# Patient Record
Sex: Female | Born: 1965 | Race: White | Hispanic: No | Smoking: Never smoker
Health system: Southern US, Community
[De-identification: ages and names within clinical notes are randomized; demographics above are authoritative.]

## PROBLEM LIST (undated history)

## (undated) DIAGNOSIS — G43909 Migraine, unspecified, not intractable, without status migrainosus: Secondary | ICD-10-CM

## (undated) DIAGNOSIS — F32A Depression, unspecified: Secondary | ICD-10-CM

## (undated) DIAGNOSIS — E039 Hypothyroidism, unspecified: Secondary | ICD-10-CM

## (undated) DIAGNOSIS — K219 Gastro-esophageal reflux disease without esophagitis: Secondary | ICD-10-CM

## (undated) DIAGNOSIS — F329 Major depressive disorder, single episode, unspecified: Secondary | ICD-10-CM

## (undated) DIAGNOSIS — Z8632 Personal history of gestational diabetes: Secondary | ICD-10-CM

## (undated) DIAGNOSIS — K589 Irritable bowel syndrome without diarrhea: Secondary | ICD-10-CM

## (undated) DIAGNOSIS — F419 Anxiety disorder, unspecified: Secondary | ICD-10-CM

## (undated) DIAGNOSIS — G5603 Carpal tunnel syndrome, bilateral upper limbs: Secondary | ICD-10-CM

## (undated) DIAGNOSIS — I10 Essential (primary) hypertension: Secondary | ICD-10-CM

## (undated) DIAGNOSIS — T7840XA Allergy, unspecified, initial encounter: Secondary | ICD-10-CM

## (undated) DIAGNOSIS — N8110 Cystocele, unspecified: Secondary | ICD-10-CM

## (undated) DIAGNOSIS — C801 Malignant (primary) neoplasm, unspecified: Secondary | ICD-10-CM

## (undated) DIAGNOSIS — Z8619 Personal history of other infectious and parasitic diseases: Secondary | ICD-10-CM

## (undated) HISTORY — DX: Cystocele, unspecified: N81.10

## (undated) HISTORY — DX: Personal history of other infectious and parasitic diseases: Z86.19

## (undated) HISTORY — DX: Major depressive disorder, single episode, unspecified: F32.9

## (undated) HISTORY — DX: Hypothyroidism, unspecified: E03.9

## (undated) HISTORY — DX: Allergy, unspecified, initial encounter: T78.40XA

## (undated) HISTORY — DX: Irritable bowel syndrome, unspecified: K58.9

## (undated) HISTORY — DX: Anxiety disorder, unspecified: F41.9

## (undated) HISTORY — DX: Migraine, unspecified, not intractable, without status migrainosus: G43.909

## (undated) HISTORY — PX: WISDOM TOOTH EXTRACTION: SHX21

## (undated) HISTORY — PX: COLONOSCOPY: SHX174

## (undated) HISTORY — DX: Depression, unspecified: F32.A

---

## 1999-05-24 ENCOUNTER — Encounter: Admission: RE | Admit: 1999-05-24 | Discharge: 1999-05-24 | Payer: Self-pay | Admitting: *Deleted

## 1999-07-07 ENCOUNTER — Other Ambulatory Visit: Admission: RE | Admit: 1999-07-07 | Discharge: 1999-07-07 | Payer: Self-pay | Admitting: Gynecology

## 2000-06-05 ENCOUNTER — Other Ambulatory Visit: Admission: RE | Admit: 2000-06-05 | Discharge: 2000-06-05 | Payer: Self-pay | Admitting: Obstetrics and Gynecology

## 2000-08-14 ENCOUNTER — Encounter: Payer: Self-pay | Admitting: Obstetrics and Gynecology

## 2000-08-14 ENCOUNTER — Ambulatory Visit (HOSPITAL_COMMUNITY): Admission: RE | Admit: 2000-08-14 | Discharge: 2000-08-14 | Payer: Self-pay | Admitting: Obstetrics and Gynecology

## 2001-07-03 ENCOUNTER — Other Ambulatory Visit: Admission: RE | Admit: 2001-07-03 | Discharge: 2001-07-03 | Payer: Self-pay | Admitting: Obstetrics and Gynecology

## 2002-08-06 ENCOUNTER — Other Ambulatory Visit: Admission: RE | Admit: 2002-08-06 | Discharge: 2002-08-06 | Payer: Self-pay | Admitting: Obstetrics and Gynecology

## 2003-12-30 ENCOUNTER — Other Ambulatory Visit: Admission: RE | Admit: 2003-12-30 | Discharge: 2003-12-30 | Payer: Self-pay | Admitting: Obstetrics and Gynecology

## 2005-02-09 ENCOUNTER — Other Ambulatory Visit: Admission: RE | Admit: 2005-02-09 | Discharge: 2005-02-09 | Payer: Self-pay | Admitting: Obstetrics and Gynecology

## 2005-07-04 ENCOUNTER — Ambulatory Visit (HOSPITAL_COMMUNITY): Admission: RE | Admit: 2005-07-04 | Discharge: 2005-07-04 | Payer: Self-pay | Admitting: Obstetrics and Gynecology

## 2005-12-15 ENCOUNTER — Ambulatory Visit: Payer: Self-pay | Admitting: Internal Medicine

## 2006-02-15 ENCOUNTER — Other Ambulatory Visit: Admission: RE | Admit: 2006-02-15 | Discharge: 2006-02-15 | Payer: Self-pay | Admitting: Obstetrics and Gynecology

## 2006-07-25 ENCOUNTER — Ambulatory Visit (HOSPITAL_COMMUNITY): Admission: RE | Admit: 2006-07-25 | Discharge: 2006-07-25 | Payer: Self-pay | Admitting: Obstetrics and Gynecology

## 2007-08-14 ENCOUNTER — Ambulatory Visit (HOSPITAL_COMMUNITY): Admission: RE | Admit: 2007-08-14 | Discharge: 2007-08-14 | Payer: Self-pay | Admitting: Obstetrics and Gynecology

## 2007-08-28 ENCOUNTER — Ambulatory Visit: Payer: Self-pay | Admitting: Internal Medicine

## 2007-08-28 DIAGNOSIS — K5909 Other constipation: Secondary | ICD-10-CM | POA: Insufficient documentation

## 2007-08-28 DIAGNOSIS — O99810 Abnormal glucose complicating pregnancy: Secondary | ICD-10-CM | POA: Insufficient documentation

## 2007-09-10 ENCOUNTER — Ambulatory Visit: Payer: Self-pay | Admitting: Cardiology

## 2007-09-25 ENCOUNTER — Ambulatory Visit (HOSPITAL_COMMUNITY): Admission: RE | Admit: 2007-09-25 | Discharge: 2007-09-25 | Payer: Self-pay | Admitting: Internal Medicine

## 2007-09-25 ENCOUNTER — Encounter: Payer: Self-pay | Admitting: Internal Medicine

## 2007-09-25 HISTORY — PX: COLONOSCOPY: SHX174

## 2008-09-02 ENCOUNTER — Ambulatory Visit (HOSPITAL_COMMUNITY): Admission: RE | Admit: 2008-09-02 | Discharge: 2008-09-02 | Payer: Self-pay | Admitting: Obstetrics and Gynecology

## 2008-09-08 ENCOUNTER — Encounter: Admission: RE | Admit: 2008-09-08 | Discharge: 2008-09-08 | Payer: Self-pay | Admitting: Obstetrics and Gynecology

## 2009-02-08 IMAGING — CT CT ABDOMEN W/ CM
2 of 5 series · 17 of 46 positions shown, 19 images · IV contrast (Omnipaque 300)
Comparison: None.

CT ABDOMEN

CLINICAL DATA: 41-year-old female with right lower quadrant
abdominal pain.

CT ABDOMEN AND PELVIS WITH CONTRAST
TECHNIQUE: Multidetector CT imaging of the abdomen and pelvis was
performed using the standard protocol following bolus
administration of intravenous contrast.
Contrast: 100 ml Zmnipaque-177

[Series 2: abd_pel 5.0 b30f st · axial · 0.69mm/px · z∈[-402,-27]mm · 14 of 85 slices shown, 16 images]
[im 5/85  soft-tissue]
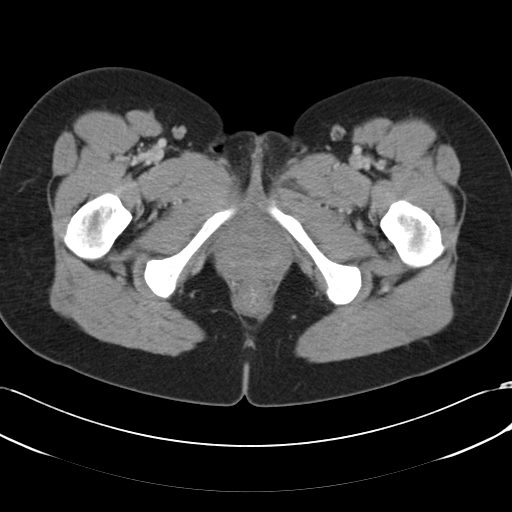
[im 5/85  bone]
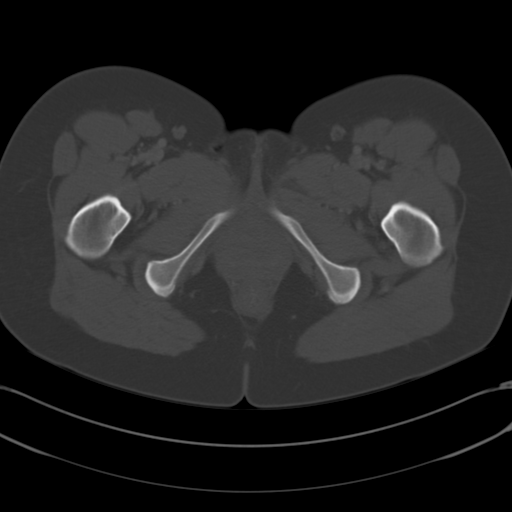
[im 9/85  soft-tissue]
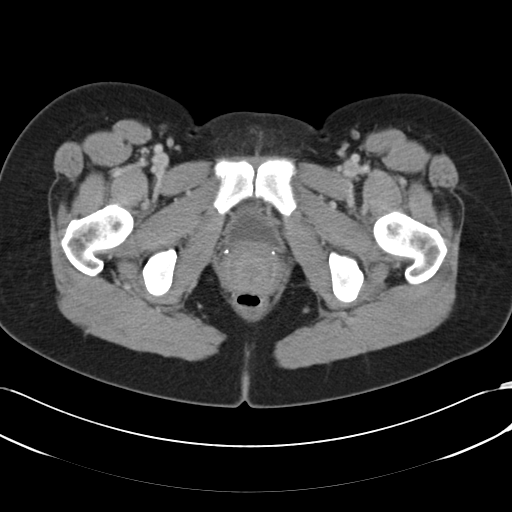
[im 18/85  soft-tissue]
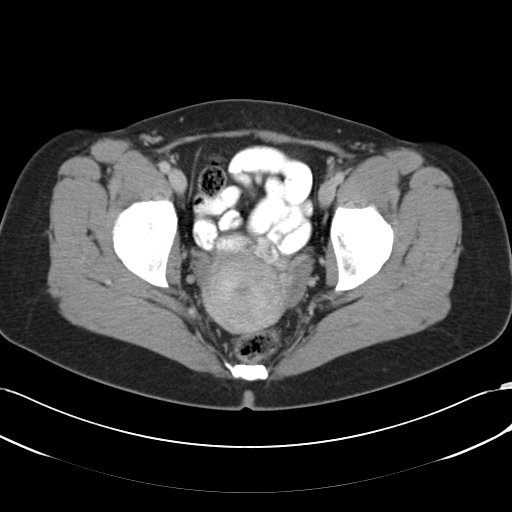
[im 23/85  soft-tissue]
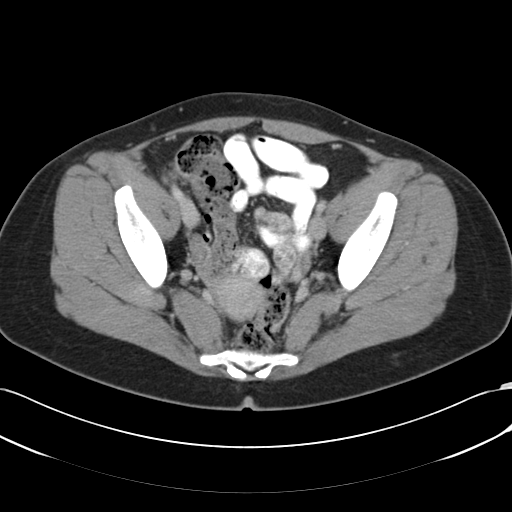
[im 27/85  soft-tissue]
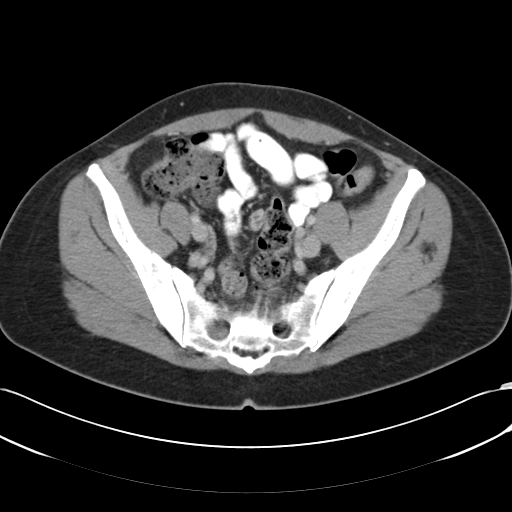
[im 36/85  soft-tissue]
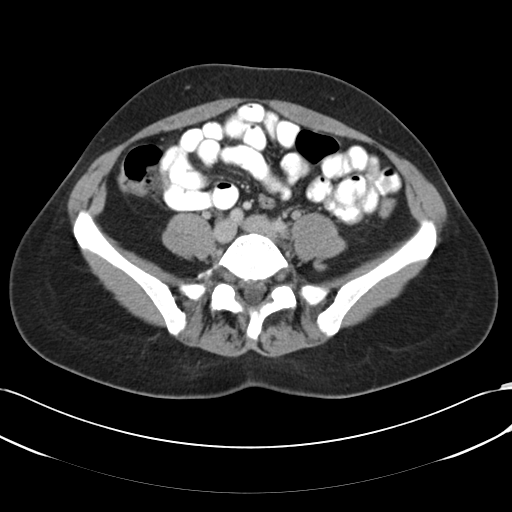
[im 40/85  soft-tissue]
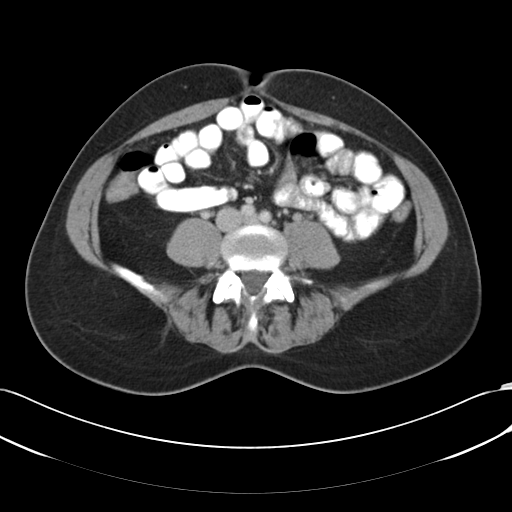
[im 45/85  soft-tissue]
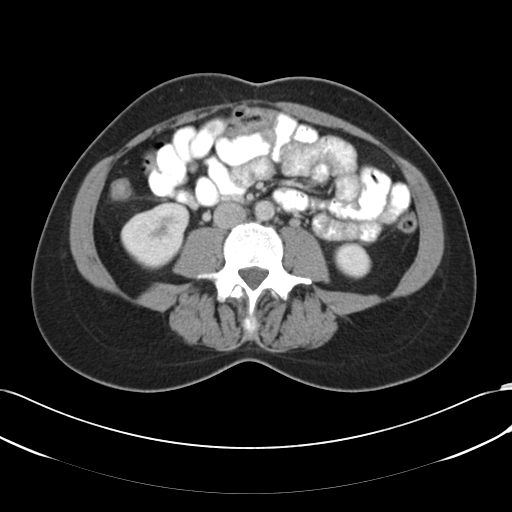
[im 49/85  soft-tissue]
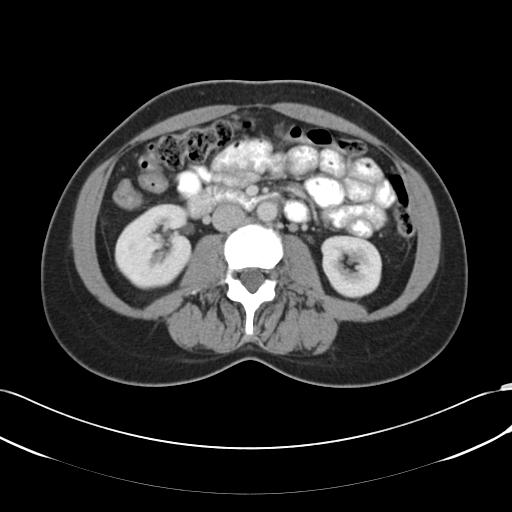
[im 49/85  bone]
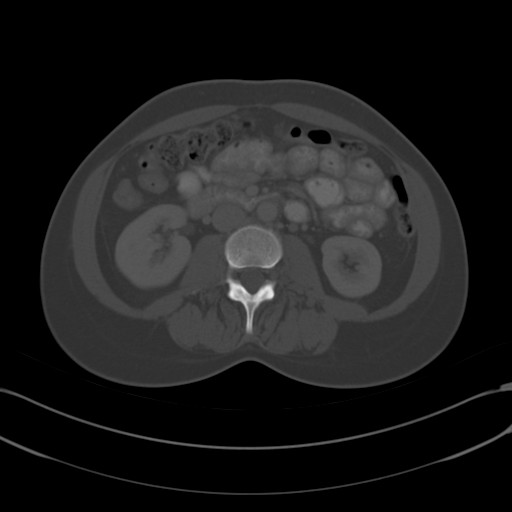
[im 58/85  soft-tissue]
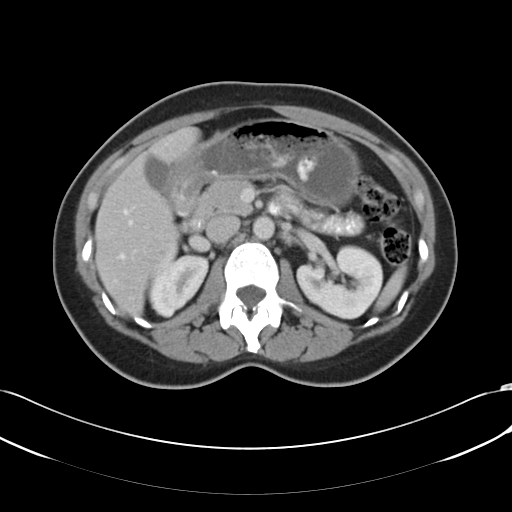
[im 62/85  soft-tissue]
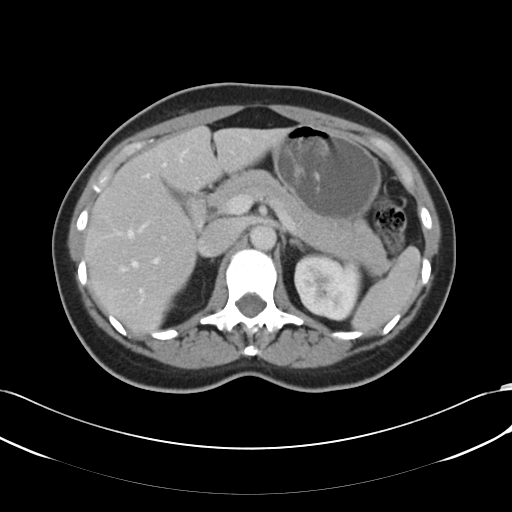
[im 67/85  soft-tissue]
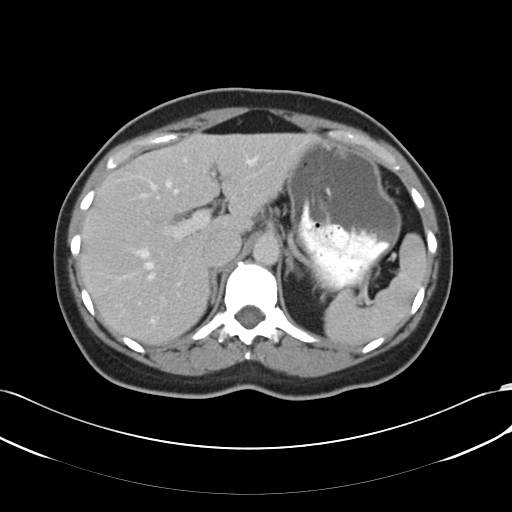
[im 76/85  soft-tissue]
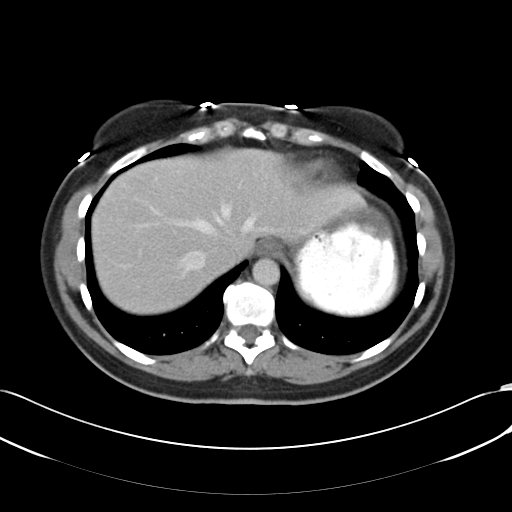
[im 80/85  soft-tissue]
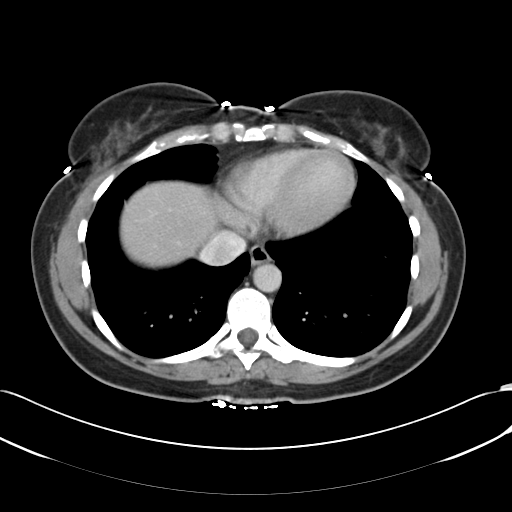

[Series 602: <mpr thick range> · coronal · 0.84mm/px · 3 of 74 slices shown]
[im 25/74  soft-tissue]
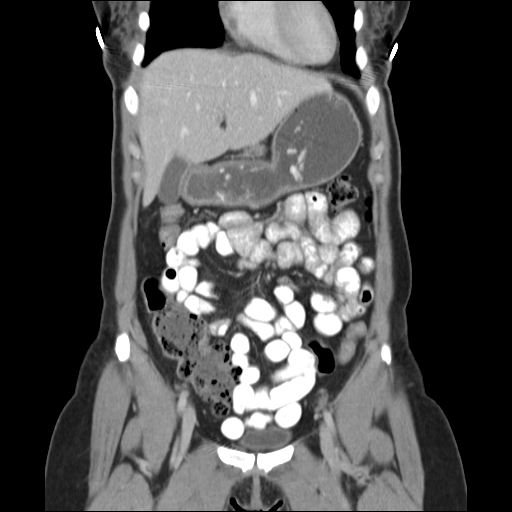
[im 33/74  soft-tissue]
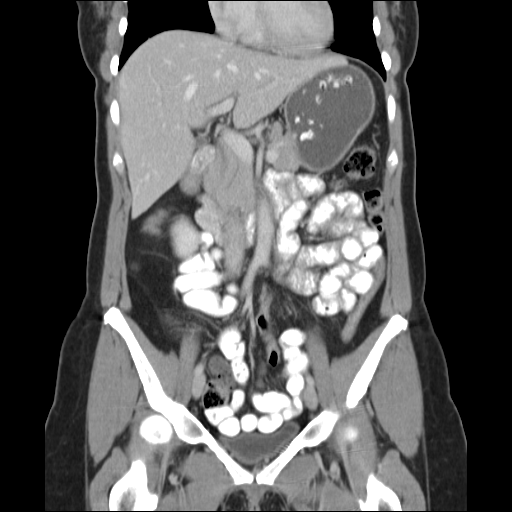
[im 41/74  soft-tissue]
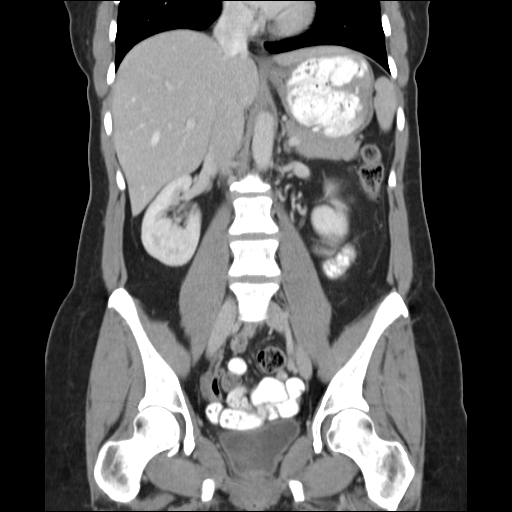

[17 of 46 positions shown; findings below may reference images not displayed]

FINDINGS: The lung bases are clear.  No pericardial or pleural
effusion.  Normal heart size.  Negative for hiatal hernia.

The liver, gallbladder, biliary system, adrenal glands, kidneys,
spleen, and pancreas are within normal limits for age.  No acute
abdominal inflammation, fluid collection, hemorrhage, abscess,
bowel obstruction, dilatation, or free air.  No abdominal wall
hernia.  In the right lower quadrant, the cecum and terminal ileum
are stool-filled.  Appendix is not identified with certainty.  No
acute inflammatory process in this region.
IMPRESSION: No acute intra-abdominal finding.  Negative for hernia.

CT PELVIS
FINDINGS: No pelvic acute inflammation, fluid collection, free
fluid, abscess or hematoma.  No adenopathy.  No acute bowel
finding.  Uterus, ovaries and urinary bladder are unremarkable.
Negative for lower abdominal or inguinal hernia.
IMPRESSION: No acute intrapelvic finding.  Negative for hernia.

## 2009-09-15 ENCOUNTER — Ambulatory Visit (HOSPITAL_COMMUNITY): Admission: RE | Admit: 2009-09-15 | Discharge: 2009-09-15 | Payer: Self-pay | Admitting: Obstetrics and Gynecology

## 2010-07-20 ENCOUNTER — Other Ambulatory Visit (HOSPITAL_COMMUNITY): Payer: Self-pay | Admitting: Family Medicine

## 2010-07-20 DIAGNOSIS — E039 Hypothyroidism, unspecified: Secondary | ICD-10-CM

## 2010-07-20 DIAGNOSIS — M542 Cervicalgia: Secondary | ICD-10-CM

## 2010-07-25 ENCOUNTER — Ambulatory Visit (HOSPITAL_COMMUNITY)
Admission: RE | Admit: 2010-07-25 | Discharge: 2010-07-25 | Disposition: A | Discharge status: Home / Self Care | Payer: Managed Care, Other (non HMO) | Source: Ambulatory Visit | Attending: Family Medicine | Admitting: Family Medicine

## 2010-07-25 DIAGNOSIS — E039 Hypothyroidism, unspecified: Secondary | ICD-10-CM | POA: Insufficient documentation

## 2010-07-25 DIAGNOSIS — M542 Cervicalgia: Secondary | ICD-10-CM | POA: Insufficient documentation

## 2010-07-25 DIAGNOSIS — E049 Nontoxic goiter, unspecified: Secondary | ICD-10-CM | POA: Insufficient documentation

## 2010-08-10 ENCOUNTER — Other Ambulatory Visit (HOSPITAL_COMMUNITY): Payer: Self-pay | Admitting: Obstetrics and Gynecology

## 2010-08-10 DIAGNOSIS — Z1231 Encounter for screening mammogram for malignant neoplasm of breast: Secondary | ICD-10-CM

## 2010-09-21 ENCOUNTER — Ambulatory Visit (HOSPITAL_COMMUNITY)
Admission: RE | Admit: 2010-09-21 | Discharge: 2010-09-21 | Disposition: A | Discharge status: Home / Self Care | Payer: Self-pay | Source: Ambulatory Visit | Attending: Obstetrics and Gynecology | Admitting: Obstetrics and Gynecology

## 2010-09-21 DIAGNOSIS — Z1231 Encounter for screening mammogram for malignant neoplasm of breast: Secondary | ICD-10-CM | POA: Insufficient documentation

## 2010-10-04 NOTE — Assessment & Plan Note (Signed)
[facility]                         GASTROENTEROLOGY OFFICE NOTE   ZANITA, MILLMAN                   MRN:          [id number]  DATE:08/28/2007                            DOB:          Feb 01, 1966    Ms.  Merlin is a 45 year old white female referred by Dr. Andrey Campanile for  evaluation of change in bowel habits and right middle quadrant and right  lower quadrant abdominal pain. We had seen Mercie in 2007 for treatment  of constipation.  She was put on MiraLax and stool softener as well as  glycerin suppositories.  She was evaluated for rectocele by her  gynecologist but has continued to be constipated having difficult  evacuation.  She has recently found probiotics, Align, which seems to  improve her symptoms of constipation.  She is now having regular bowel  movements as long as she takes her Librarian, academic.  She was unable to tolerate  Amitiza and MiraLax.  She denies any rectal bleeding.  There is no  family history of colon cancer.  She is a runner but did not run during  the wintertime.  She is just starting to run again.  While she is  running, she is complaining of right middle quadrant abdominal  discomfort.  The same discomfort also occurs first in the morning before  she gets up when she turns a certain way.  It suggests musculoskeletal  origin.   MEDICATIONS:  1. Align 1 p.o. q d.  2. Birth control pills.   PAST HISTORY:  1. Constipation.  2. Gestational diabetes.   OPERATIONS:  None.   FAMILY HISTORY:  Father has prostate cancer.  No family history of colon  cancer.   SOCIAL HISTORY:  Married with three children.  She has two years of  college.  The patient does not smoke and does not drink alcohol.   REVIEW OF SYSTEMS:  Positive for leakage of urine, constipation,  abdominal pain, otherwise review of systems is negative.   PHYSICAL EXAMINATION:  Blood pressure is 118/72, pulse 80 and weight  133.4 pounds which is a 10-pound weight  gain since we saw her last time  two years ago.  She was alert, oriented, very pleasant in no distress.  Sclera is nonicteric, oral cavity was normal.  Neck was supple with no lymphadenopathy.  Lungs were clear to auscultation.  COR:  Reveals a quiet precordium, normal S1, normal S2.  ABDOMEN:  Soft with good muscle tone.  When sitting up and laying down,  there was a tenderness in the abdominal wall in the right lower  quadrant.  The same could be reproduced by right leg raising or pushing  up or pushing down.  I could not appreciate any hernia.  The groin did  not seem to be tender.  Liver edge was at the costal margin, bowel  sounds were normoactive.  Left lower and upper quadrants were normal.  RECTAL EXAM:  Showed normal rectal tone with soft and hemoccult negative  stool.  There was no evidence of fecal impaction.   IMPRESSION:  1. A 45 year old white female with abdominal pain and constipation.  As far as the abdominal pain is concerned, some of it has elements      of musculoskeletal pain originating in the abdominal wall or      possiblyit may be due to  an internal hernia.  For that reason, I      would proceed with CT scan of the abdomen and pelvis to rule out      structural abnormality within the abdomen  2. Constipation with pelvic floor dysfunction.  Rule out colonic      inertia, rule out redundant colon.  Rule out diverticular disease      of the colon.  Doubt colonic obstruction.   PLAN:  1. Continue Align probiotic  2. Booklet on constipation.  3. Colonoscopy scheduled.  4. CT scan of the abdomen and pelvis with attention to right lower      quadrant to rule out internal hernia.     Hedwig Morton. Juanda Chance, MD  Electronically Signed    DMB/MedQ  DD: 08/28/2007  DT: 08/28/2007  Job #: [id number]   cc:   Gloriajean Dell. Andrey Campanile, M.D.  Evelena Peat, M.D.

## 2010-10-07 NOTE — Assessment & Plan Note (Signed)
[facility]                           GASTROENTEROLOGY OFFICE NOTE   Tamara Mccoy, Tamara Mccoy                   MRN:          [id number]  DATE:12/15/2005                            DOB:          03-Jan-1966    Referring physician - self referred.   PROBLEM:  Constipation and difficulty evacuating bowels.   HISTORY:  Tamara Mccoy is a pleasant 45 year old white female generally in good  health who has no known chronic medical problems and is not on any regular  medications.  She says that she has had her current problem of her eleven  years since the birth of her last child.  She said she had a very difficult  labor and actually developed some diastasis of her lower abdominal muscles  because of that labor.  She says ever since that time she has had trouble  with constipation.  She says at times, she is able to have a normal bowel  movement but often has to push on her abdominal wall to defecate and at  times has to manually extract the stool because despite straining etc., she  is unable to evacuate the stool.  She occasionally sees a small amount of  bright red blood on the tissue.  Has not had any more extensive bleeding.  She says she always has a constipated pellet-like stools.  She apparently  did recent hemoccult through [facility] which were negative and has  tried Citracal but has not tried any other laxatives, fiber supplements etc.  She is unclear whether or not she had been evaluated for rectocele,  cystocele.  She is due for followup with her gynecologist Dr. Huel Cote in September.   CURRENT MEDICATIONS:  None.   ALLERGIES:  Had blurred vision with the Z-PAK.   PAST HISTORY:  Benign.  Status post childbirth x3.  She did have gestational  diabetes.   FAMILY HISTORY:  Negative for colon cancer, inflammatory bowel disease,  polyposis.  Father with prostate cancer.   SOCIAL HISTORY:  Patient is married.  She has 3 children.   She is employed  with the [employer] ([employer]).   REVIEW OF SYSTEMS:  Negative.  She very occasionally has some stress urinary  incontinence.  Last menstrual period November 22, 2005.   PHYSICAL EXAMINATION:  GENERAL:  Well developed white female in no acute  distress.  CARDIOVASCULAR:  Regular rate and rhythm with S1 and S2.  PULMONARY:  Clear to auscultation and percussion.  ABDOMEN:  Soft, bowel sounds are active.  She is non-tender. There is no  palpable mass or hepatosplenomegaly.  RECTAL:  Negative for occult blood.  Sphincter tone is normal.  No mass.  VITAL SIGNS: Blood pressure 118/72.  Weight is 123.6.  Pulse is 80.   IMPRESSION:  45 year old white female with chronic constipation and symptoms  consistent with pelvic floor relaxation/dysfunction.  Rule out rectocele  though she does not really have any urinary symptoms.   PLAN:  1.  Patient was advised to push fluids - 6-8 glasses of water per day. Add      Benefiber 1 packet per  day in water.  2.  Add MiraLax 17 grams in 8 oz. of  water daily.  3.  Daily exercise in the form of walking or biking etc. to strengthen      pelvic muscles.  4.  Glycerin suppositories 1-2 times weekly on an as needed basis.  5.  Would ask Dr. Senaida Ores to evaluate the patient for pelvic floor      dysfunction and rectocele at the time of her next gynecological      appointment.                                   Amy Grainfield, PA-C                                Dora M. Juanda Chance, MD   AE/MedQ  DD:  12/15/2005  DT:  12/15/2005  Job #:  [id number]

## 2010-11-09 ENCOUNTER — Other Ambulatory Visit: Payer: Self-pay | Admitting: Endocrinology

## 2010-11-09 DIAGNOSIS — E049 Nontoxic goiter, unspecified: Secondary | ICD-10-CM

## 2011-01-31 ENCOUNTER — Other Ambulatory Visit: Payer: Managed Care, Other (non HMO)

## 2011-12-24 IMAGING — US US SOFT TISSUE HEAD/NECK
1 series · 14 of 25 positions shown · non-contrast
Comparison: None.

CLINICAL DATA: Neck pain.  Hypothyroidism.

THYROID ULTRASOUND
TECHNIQUE: Ultrasound examination of the thyroid gland and adjacent
soft tissues was performed.

[Series 1: us soft tissue head/neck · 0.08mm/px · 14 of 54 slices shown]
[im 1/54]
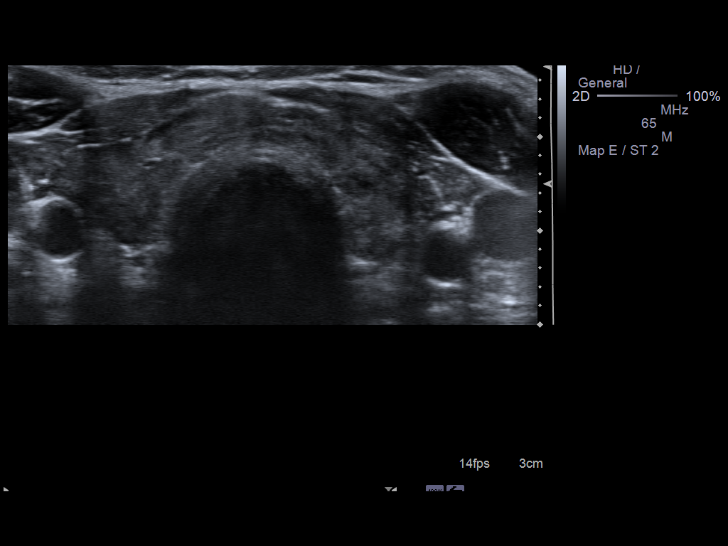
[im 5/54]
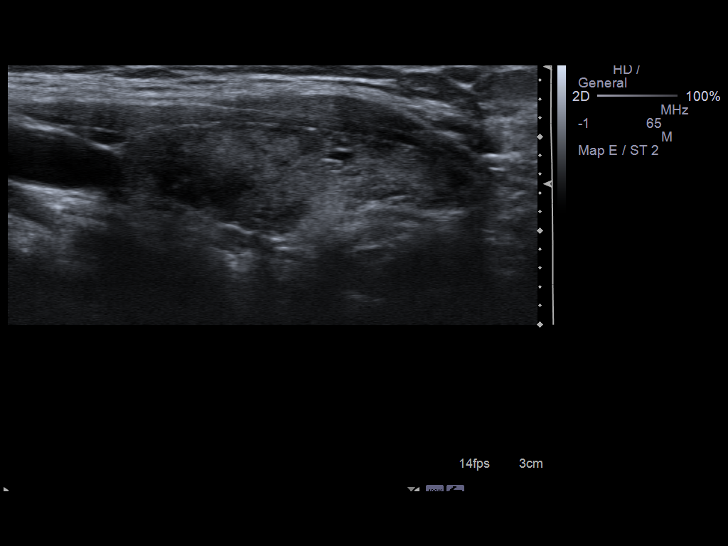
[im 9/54]
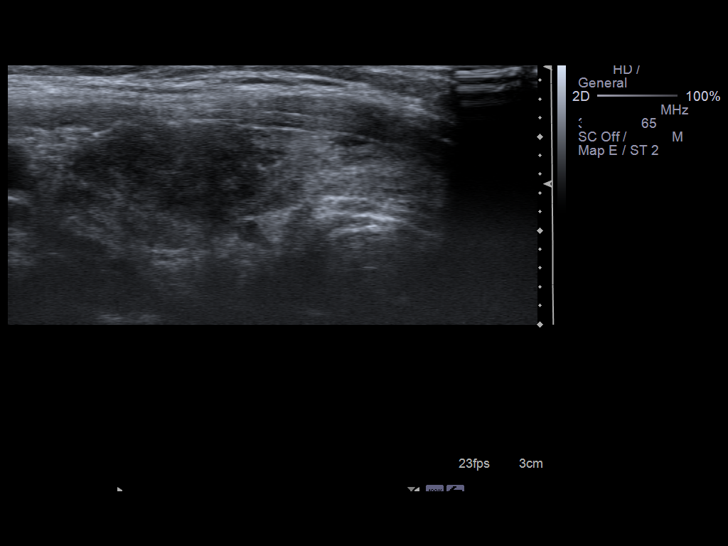
[im 14/54]
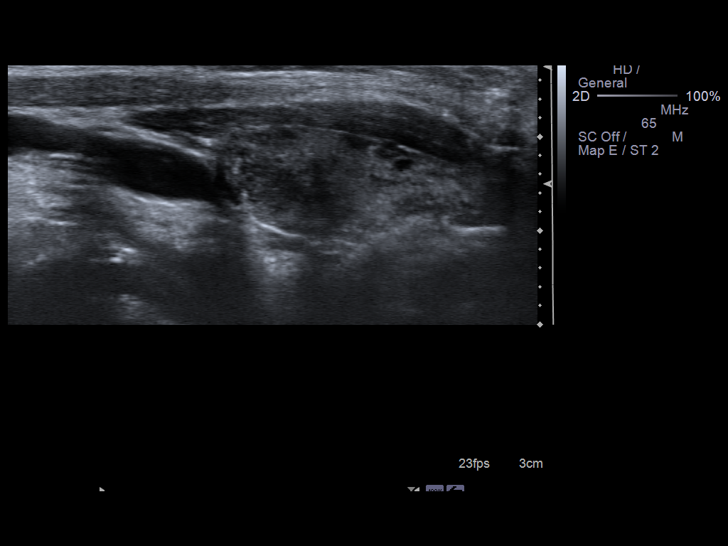
[im 18/54]
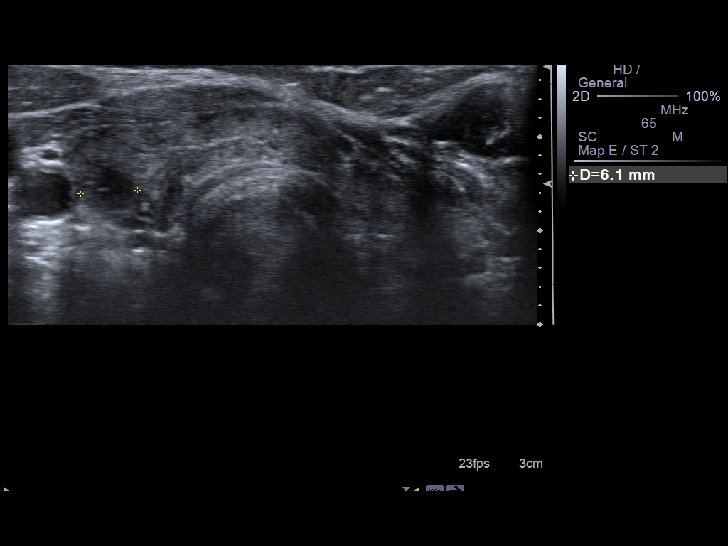
[im 20/54]
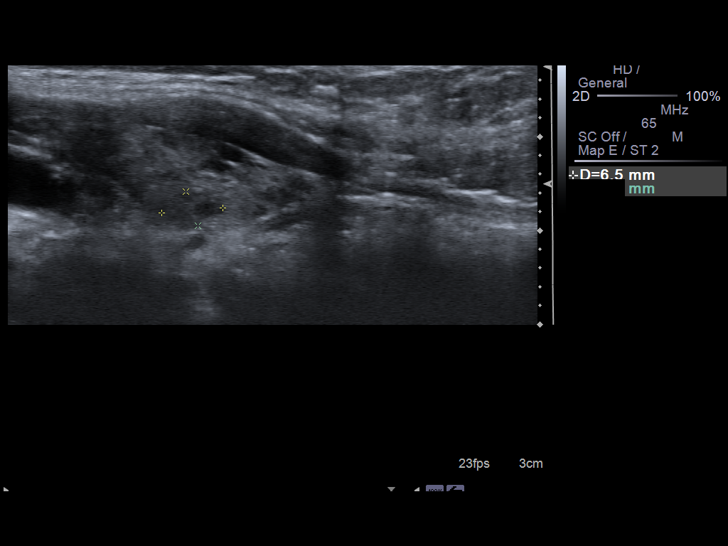
[im 25/54]
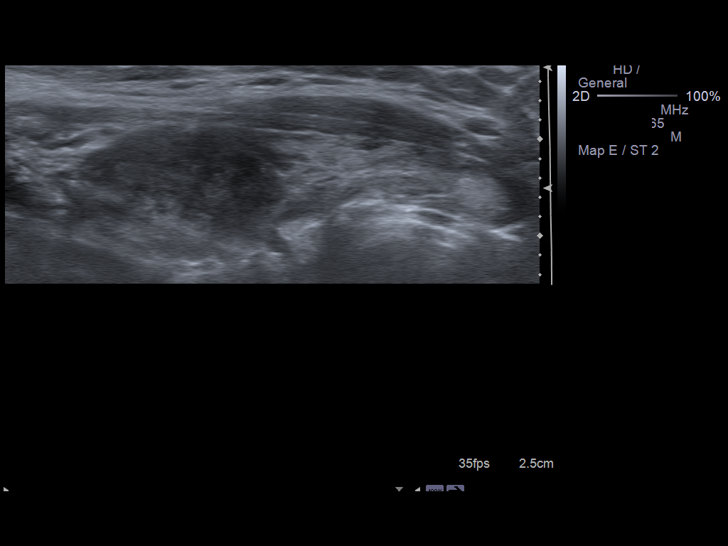
[im 29/54]
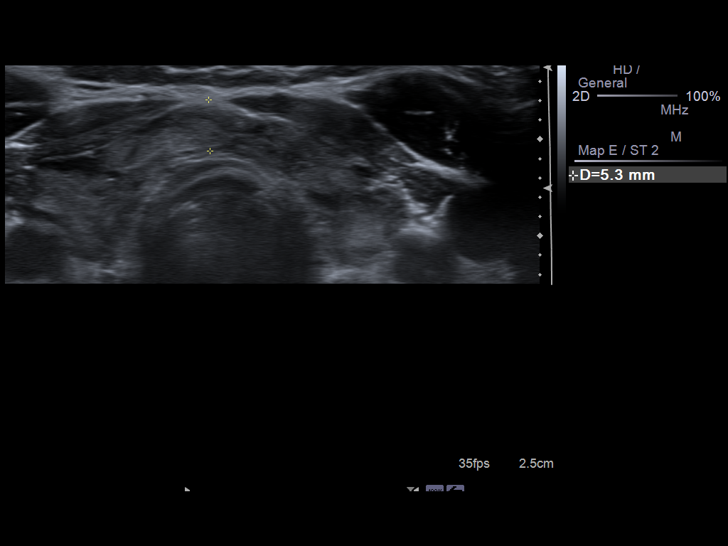
[im 34/54]
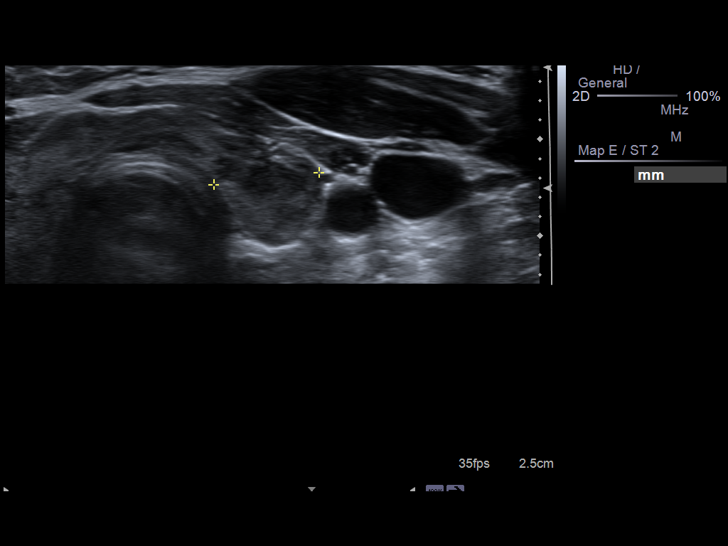
[im 36/54]
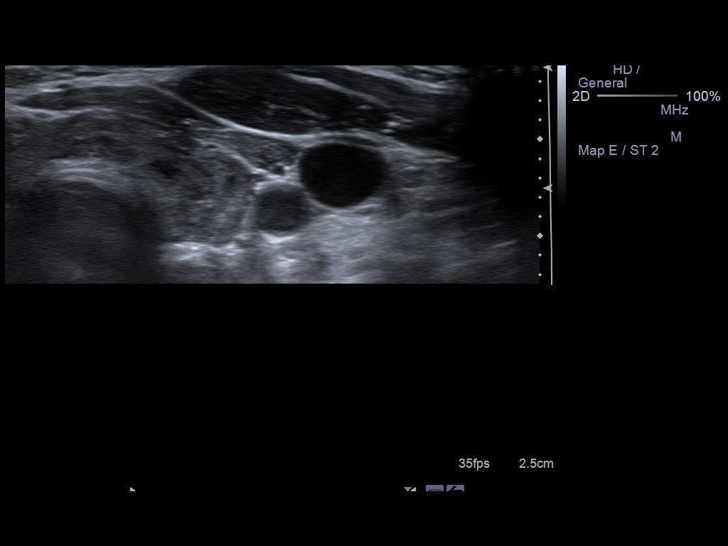
[im 40/54]
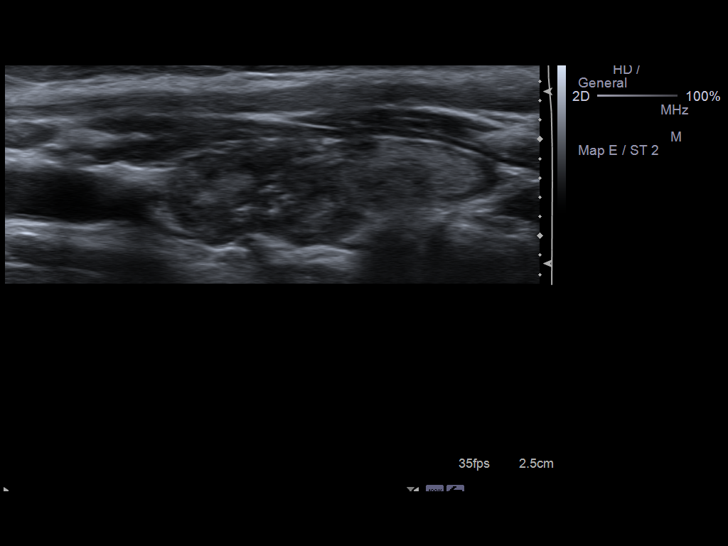
[im 45/54]
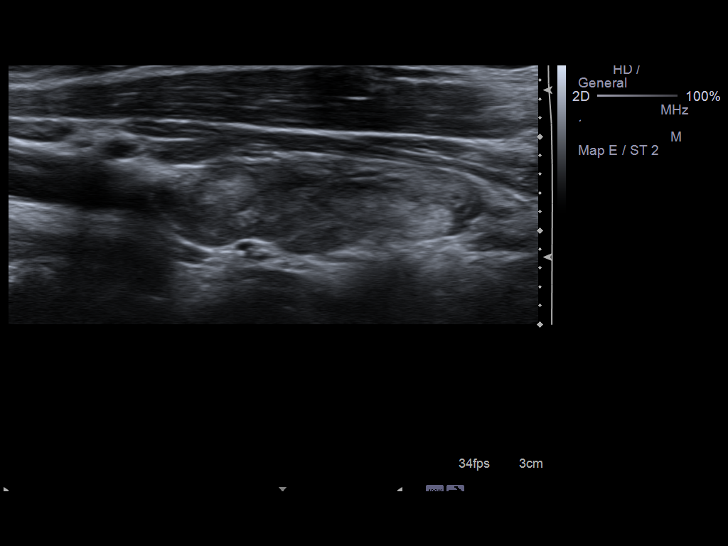
[im 49/54]
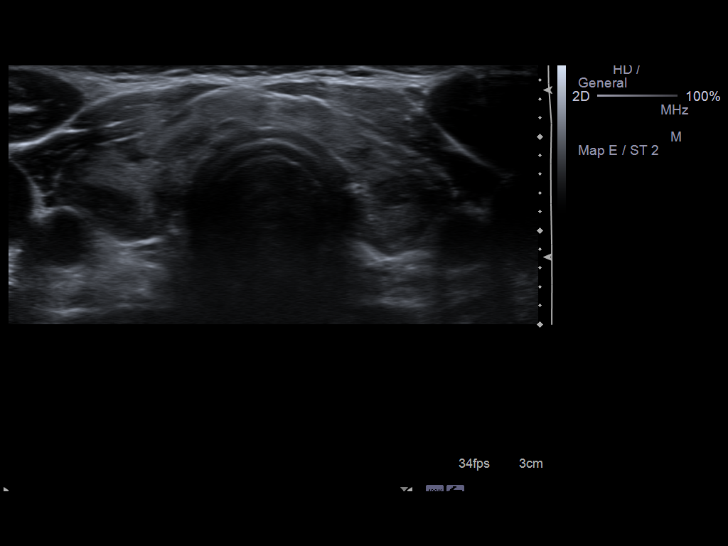
[im 54/54]
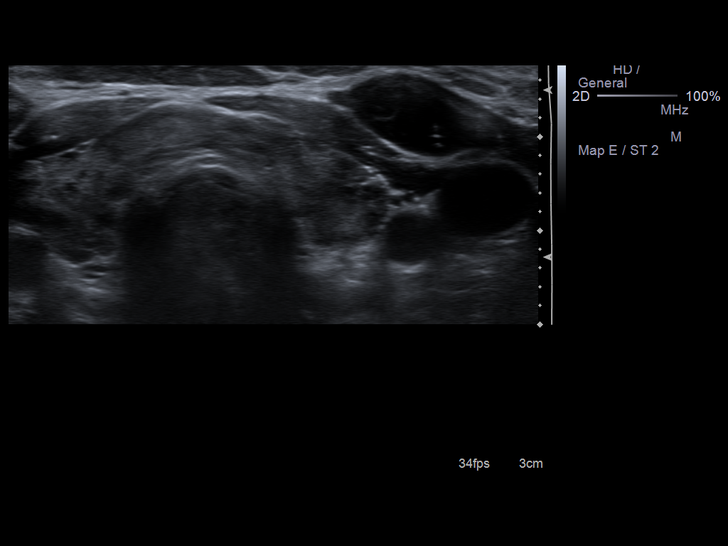

[14 of 25 positions shown; findings below may reference images not displayed]

FINDINGS: Right thyroid lobe:  3.9 x 1.1 x 1.4 cm
Left thyroid lobe:  3.6 x 1.1 x 1.1 cm
Isthmus:  5 mm

Focal nodules:  Thyroid echotexture is heterogeneous.  No well-
defined nodule or dominant mass identified.  Possible hypoechoic
lesion measures 8 mm in the inter/lower pole right lobe on image
16.

Suspect a hypoechoic nodule within the medial interpolar right
lobe.  1.0 x 0.9 x 0.8 cm on image 9 image 26.

A 5 mm hyperechoic nodule in the lower pole left lobe on image 41.
A 7 mm hyperechoic upper pole left sided nodule.

No abnormal calcifications.

Lymphadenopathy:  None visualized.
IMPRESSION: 1.  Heterogeneous thyroid echotexture likely represents
multinodular goiter.  Thyroiditis could have this appearance.
2.  No dominant nodule or mass identified.  Small nonspecific
nodules as detailed above.

## 2012-10-30 ENCOUNTER — Other Ambulatory Visit: Payer: Self-pay

## 2012-10-30 DIAGNOSIS — Z1231 Encounter for screening mammogram for malignant neoplasm of breast: Secondary | ICD-10-CM

## 2012-11-29 ENCOUNTER — Ambulatory Visit
Admission: RE | Admit: 2012-11-29 | Discharge: 2012-11-29 | Disposition: A | Discharge status: Home / Self Care | Payer: Managed Care, Other (non HMO) | Source: Ambulatory Visit

## 2012-11-29 DIAGNOSIS — Z1231 Encounter for screening mammogram for malignant neoplasm of breast: Secondary | ICD-10-CM

## 2013-02-10 ENCOUNTER — Other Ambulatory Visit: Payer: Self-pay | Admitting: Physician Assistant

## 2013-02-14 ENCOUNTER — Ambulatory Visit
Admission: RE | Admit: 2013-02-14 | Discharge: 2013-02-14 | Disposition: A | Discharge status: Home / Self Care | Payer: 59 | Source: Ambulatory Visit | Attending: Physician Assistant | Admitting: Physician Assistant

## 2013-03-04 ENCOUNTER — Ambulatory Visit: Payer: Managed Care, Other (non HMO) | Admitting: Neurology

## 2013-03-11 ENCOUNTER — Ambulatory Visit (INDEPENDENT_AMBULATORY_CARE_PROVIDER_SITE_OTHER): Payer: 59 | Admitting: Neurology

## 2013-03-11 ENCOUNTER — Encounter: Payer: Self-pay | Admitting: Neurology

## 2013-03-11 VITALS — BP 121/76 | HR 70 | Ht 63.0 in | Wt 136.0 lb

## 2013-03-11 DIAGNOSIS — G243 Spasmodic torticollis: Secondary | ICD-10-CM | POA: Insufficient documentation

## 2013-03-11 NOTE — Patient Instructions (Signed)
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   Options to treat in the future include massage therapy, physical therapy, muscle relaxants and botulinum toxin therapy.   I would like to see you back in 1 year, sooner if we need to. Please call us with any interim questions, concerns, problems, updates or refill requests.   My clinical assistant and will answer any of your questions and relay your messages to me and also relay most of my messages to you.   Our phone number is [phone]. We also have an after hours call service for urgent matters and there is a physician on-call for urgent questions. For any emergencies you know to call 911 or go to the nearest emergency room

## 2013-03-11 NOTE — Progress Notes (Signed)
[facility]    Provider:  Dr Hosie Poisson Referring Provider: Halina Maidens., MD Primary Care Physician:  Warrick Parisian, MD  CC:  Neck pain  HPI:  Tamara Mccoy is a 47 y.o. female here as a referral from Dr. Creta Levin for neck pain  Has had multiple year history of neck pain. She notes it most when she is exercising and working out aura the day goes on. She is very tight in the anterior region and lateral region of her neck, especially in the bilateral sternocleidomastoid. She doesn't also walking up a hill, running. She feels a sensation of her head pulling forward. It is uncomfortable, described as a dull aching cramping pain. Causing difficulty with her ability to run and work out. Also affecting her at work she does a lot of repetitive motion with her arms. Also notes some hoarseness in her voice, that gets worse with the excessive talking. Also notes some cramping aching pain in the bilateral trapezius region. This has responded to muscle relaxants in the past, this did make her feel fatigued. No history of neck trauma. Occasionally will get headaches, that she describes as migrainous in nature, occasional nausea and vomiting some photo and phonophobia. Described as a dull throbbing pain. The common with and independent of the neck pain. She takes a triptan which helps  with the symptoms.  Review of Systems: Out of a complete 14 system review, the patient complains of only the following symptoms, and all other reviewed systems are negative. Positive weight gain headache anxiety  History   Social History  . Marital Status: Married    Spouse Name: Tim    Number of Children: 3  . Years of Education: 14   Occupational History  .  [employer]   Social History Main Topics  . Smoking status: Never Smoker   . Smokeless tobacco: Never Used  . Alcohol Use: Yes     Comment: moderate  . Drug Use: No  . Sexual Activity: Not on file   Other Topics Concern    . Not on file   Social History Narrative   Patient lives at home with husband Jorja Loa and one son.    Patient has 3 children.    Patient has some college.    Patient works at [employer].     Family History  Problem Relation Age of Onset  . COPD Mother   . Kidney failure Mother     only one working   . Heart Problems Mother     stent   . Heart block Father   . Prostate cancer Father   . Leukemia Maternal Grandmother   . Heart attack Maternal Grandfather   . Heart Problems Paternal Grandmother     bypass  . Prostate cancer Paternal Grandfather     Past Medical History  Diagnosis Date  . Hypothyroidism     History reviewed. No pertinent past surgical history.  Current Outpatient Prescriptions  Medication Sig Dispense Refill  . levothyroxine (SYNTHROID, LEVOTHROID) 88 MCG tablet       . rizatriptan (MAXALT-MLT) 10 MG disintegrating tablet        No current facility-administered medications for this visit.    Allergies as of 03/11/2013 - Review Complete 03/11/2013  Allergen Reaction Noted  . Iohexol  09/10/2007    Vitals: BP 121/76  Pulse 70  Ht 5\' 3"  (1.6 m)  Wt 136 lb (61.689 kg)  BMI 24.1 kg/m2 Last Weight:  Wt Readings from Last 1  Encounters:  03/11/13 136 lb (61.689 kg)   Last Height:   Ht Readings from Last 1 Encounters:  03/11/13 5\' 3"  (1.6 m)     Physical exam: Exam: Gen: NAD, conversant Eyes: anicteric sclerae, moist conjunctivae HENT: Atraumatic, oropharynx clear Neck: Trachea midline; supple,  Lungs: CTA, no wheezing, rales, rhonic                          CV: RRR, no MRG Abdomen: Soft, non-tender;  Extremities: No peripheral edema  Skin: Normal temperature, no rash,  Psych: Appropriate affect, pleasant  Neuro: MS: AA&Ox3, appropriately interactive, normal affect   Speech: fluent w/o paraphasic error  Memory: good recent and remote recall  CN: PERRL, EOMI no nystagmus, no ptosis, sensation intact to LT V1-V3 bilat, face symmetric, no  weakness, hearing grossly intact, palate elevates symmetrically, shoulder shrug 5/5 bilat,  tongue protrudes midline, no fasiculations noted.  Motor: normal bulk and tone, hypertrophy L SCM, mild R torticolis, elevation L shoulder, tenderness to palpation in bilateral trapezius region Strength: 5/5  In all extremities  Coord: rapid alternating and point-to-point (FNF, HTS) movements intact.  Reflexes: symmetrical, bilat downgoing toes  Sens: LT intact in all extremities  Gait: posture, stance, stride and arm-swing normal. Tandem gait intact. Able to walk on heels and toes. Romberg absent.   Assessment:  After physical and neurologic examination, review of laboratory studies, imaging, neurophysiology testing and pre-existing records, assessment will be reviewed on the problem list.  Plan:  Treatment plan and additional workup will be reviewed under Problem List.  1)Cervical Dystonia  47 year old woman with chronic history of neck pain, exacerbated with exercise repetitive motion. Presents as a aching dull cramping pain which can affect her ability to exercise. At this time her symptoms are most consistent with a diagnosis of cervical dystonia. Question if the hoarseness of voice potentially could be a laryngeal dystonia. This diagnosis was discussed with the patient extensively, she expressed understanding of the diagnosis. At this time she wishes to hold off on any treatment. In the future can consider massage therapy, physical therapy, muscle relaxants or botulinum toxin therapy. Patient will followup in 6 months to one year or as needed.

## 2013-06-06 ENCOUNTER — Encounter (INDEPENDENT_AMBULATORY_CARE_PROVIDER_SITE_OTHER): Payer: Self-pay | Admitting: General Surgery

## 2013-06-12 ENCOUNTER — Encounter (INDEPENDENT_AMBULATORY_CARE_PROVIDER_SITE_OTHER): Payer: Self-pay | Admitting: General Surgery

## 2013-06-12 ENCOUNTER — Ambulatory Visit (INDEPENDENT_AMBULATORY_CARE_PROVIDER_SITE_OTHER): Payer: 59 | Admitting: General Surgery

## 2013-06-12 VITALS — BP 124/76 | HR 76 | Temp 99.0°F | Resp 14 | Ht 62.0 in | Wt 129.8 lb

## 2013-06-12 DIAGNOSIS — S39011A Strain of muscle, fascia and tendon of abdomen, initial encounter: Secondary | ICD-10-CM

## 2013-06-12 DIAGNOSIS — IMO0002 Reserved for concepts with insufficient information to code with codable children: Secondary | ICD-10-CM

## 2013-06-12 NOTE — Progress Notes (Signed)
Patient ID: Tamara Mccoy, female   DOB: 11/21/1965, 48 y.o.   MRN: [id number]  Chief Complaint  Patient presents with  . New Evaluation    eval Abd hernia    HPI Tamara Mccoy is a 48 y.o. female.   HPI 47 yo female referred by Dr Nolon Rod for evaluation of chronic left lower abdominal wall pain. She says the discomfort has been happening for about 5 years. It primarily occurs while she is running. It is an area of point discomfort. It really doesn't radiate. Occasionally she may have some lower back discomfort. This is the primary activity of when the discomfort occurs. She describes it as a pulling sensation. She states that it is uncomfortable when it occurs. She states that she can lift at work and it will not cause discomfort. She states if she rotates her left leg outwards it'll reproduce the same discomfort in the same area. She denies any lateral hip pain. She states that she's had numerous tests and seen several different physicians and no one can determine a reason. She denies any fever, chills, nausea, vomiting, diarrhea or constipation. She denies any weight loss. She denies any prior abdominal surgery. She did have a skin cancer removed in that area about 2 years ago. She states that she generally runs about 3 miles but is trying to increase her distance. She recently ran 6 miles & the discomfort lasted all day. She does report a little bit of stress urinary incontinence with jumping rope or doing sit-ups Past Medical History  Diagnosis Date  . Hypothyroidism   . Anxiety   . IBS (irritable bowel syndrome)   . Migraine     Past Surgical History  Procedure Laterality Date  . Wisdom tooth extraction      Family History  Problem Relation Age of Onset  . COPD Mother   . Kidney failure Mother     only one working   . Heart Problems Mother     stent   . Heart block Father   . Prostate cancer Father   . Leukemia Maternal Grandmother   . Cancer Maternal Grandmother     leukemia  . Heart attack Maternal Grandfather   . Heart Problems Paternal Grandmother     bypass  . Prostate cancer Paternal Grandfather   . Cancer Maternal Uncle     leukemia    Social History History  Substance Use Topics  . Smoking status: Never Smoker   . Smokeless tobacco: Never Used  . Alcohol Use: Yes     Comment: moderate    Allergies  Allergen Reactions  . Erythromycin   . Iohexol      Code: HIVES, Desc: Hives on face post injection of 100cc's Omni., Onset Date: [id number]   . Zithromax [Azithromycin]     Current Outpatient Prescriptions  Medication Sig Dispense Refill  . levothyroxine (SYNTHROID, LEVOTHROID) 88 MCG tablet       . rizatriptan (MAXALT-MLT) 10 MG disintegrating tablet        No current facility-administered medications for this visit.    Review of Systems Review of Systems  Constitutional: Negative for fever, activity change, appetite change and unexpected weight change.  HENT: Negative for nosebleeds and trouble swallowing.   Eyes: Negative for photophobia and visual disturbance.  Respiratory: Negative for chest tightness and shortness of breath.   Cardiovascular: Negative for chest pain and leg swelling.       Denies CP, SOB, orthopnea, PND, DOE  Genitourinary: Negative  for dysuria and difficulty urinating.  Musculoskeletal: Negative for arthralgias.  Skin: Negative for pallor and rash.  Neurological: Negative for dizziness, seizures, facial asymmetry and numbness.       Denies TIA and amaurosis fugax   Hematological: Negative for adenopathy. Does not bruise/bleed easily.  Psychiatric/Behavioral: Negative for behavioral problems and agitation.    Blood pressure 124/76, pulse 76, temperature 99 F (37.2 C), temperature source Temporal, resp. rate 14, height 5\' 2"  (1.575 m), weight 129 lb 12.8 oz (58.877 kg).  Physical Exam Physical Exam  Vitals reviewed. Constitutional: She is oriented to person, place, and time. She appears  well-developed and well-nourished. No distress.  HENT:  Head: Normocephalic and atraumatic.  Right Ear: External ear normal.  Left Ear: External ear normal.  Eyes: Conjunctivae are normal. No scleral icterus.  Neck: Normal range of motion. Neck supple. No tracheal deviation present. No thyromegaly present.  Cardiovascular: Normal rate and normal heart sounds.   Pulmonary/Chest: Effort normal and breath sounds normal. No stridor. No respiratory distress. She has no wheezes.  Abdominal: Soft. She exhibits no distension. There is no tenderness. There is no rebound and no guarding. No hernia. Hernia confirmed negative in the ventral area, confirmed negative in the right inguinal area and confirmed negative in the left inguinal area.    Patient examined supine and standing with and without Valsalva maneuvers. I also had her lift her left leg while laying supine. She has an old suprapubic incision on the left lower abdominal wall. The area of concern is on the medial aspect of that incision just above the about 2 fingerbreadths. I do not appreciate any fascial defect with and without Valsalva maneuver.  Musculoskeletal: She exhibits no edema and no tenderness.  Lymphadenopathy:    She has no cervical adenopathy.  Neurological: She is alert and oriented to person, place, and time. She exhibits normal muscle tone.  Skin: Skin is warm and dry. No rash noted. She is not diaphoretic. No erythema.  Psychiatric: She has a normal mood and affect. Her behavior is normal. Judgment and thought content normal.    Data Reviewed Dr Nichola Sizer note 2009 - RLQ pain Dr Nolon Rod note Ct a&p 2009  Assessment    Left lower quadrant abdominal wall strain     Plan    I do not appreciate a abdominal wall hernia on physical exam with the patient supine and standing with and without Valsalva maneuvers. It is not in the typical location for a "sports hernia ". It is also an atypical location for an inguinal hernia.  She has never done a period of rest. My best guess is treat this like abdominal wall strain. I advised her to avoid running for at least 4 weeks. I told her she could do some light cardiac activity like treadmill or elliptical with low incline & low resistance level. After a period of about 4 weeks she can resume running and see how she does. If she has return of discomfort, I recommended consultation with orthopedics. I do not believe diagnostic laparoscopy would be of any yield or benefit. Followup as necessary.  Leighton Ruff. Redmond Pulling, MD, FACS General, Bariatric, & Minimally Invasive Surgery [facility], [place]        [facility] M 06/12/2013, 3:34 PM

## 2013-06-12 NOTE — Patient Instructions (Signed)
Inguinal Strain Your exam shows you have an inguinal strain. This is also known as a pulled groin. This injury is usually due to a pull or partial tear to a muscle or tendon in the groin area. Most groin pulls take several weeks to heal completely. There may be pain with lifting your leg or walking during much of your recovery. Treatment for groin strains includes:  Rest and avoid lifting or performing activities that increase your pain.  Apply ice packs for 20-30 minutes every few hours to reduce pain and swelling over the next 2-3 days.  Medicine to reduce pain and inflammation is often prescribed. HOME CARE INSTRUCTIONS  While most strains in the groin area will heal with rest, you should also watch for any signs of a more serious condition.  SEEK IMMEDIATE MEDICAL CARE IF:   You notice unusual swelling or bulging in the groin.  You have pain or swelling in the testicle.  Blood in your urine.  Marked increased pain.  Weakness or numbness of your leg or abdominal pain. MAKE SURE YOU:   Understand these instructions.  Will watch your condition.  Will get help right away if you are not doing well or get worse. Document Released: 06/15/2004 Document Revised: 07/31/2011 Document Reviewed: 09/12/2007 ExitCare Patient Information 2014 [employer].  

## 2013-06-16 ENCOUNTER — Telehealth (INDEPENDENT_AMBULATORY_CARE_PROVIDER_SITE_OTHER): Payer: Self-pay | Admitting: *Deleted

## 2013-06-16 NOTE — Telephone Encounter (Signed)
Patient called to make Dr. Redmond Pulling aware that the skin cancer was removed from that same area in 2009.  She has called the dermatologist office to request they send records.

## 2013-06-17 NOTE — Telephone Encounter (Signed)
Ok. Doubt related. But have them send records.

## 2013-06-19 ENCOUNTER — Telehealth (INDEPENDENT_AMBULATORY_CARE_PROVIDER_SITE_OTHER): Payer: Self-pay | Admitting: General Surgery

## 2013-06-19 NOTE — Telephone Encounter (Signed)
LMOM for patient to call back and ask for Tamara Mccoy 

## 2013-06-19 NOTE — Telephone Encounter (Signed)
Patient called back and I went over the notes that Dr Redmond Pulling stated for me to tell the patient . I told her that Dr Redmond Pulling reviewed the derm notes and path. Do not believe previous excision of skin lesion and current symptoms are related. And patient understood and was relieved.

## 2013-07-03 ENCOUNTER — Other Ambulatory Visit (INDEPENDENT_AMBULATORY_CARE_PROVIDER_SITE_OTHER): Payer: Self-pay | Admitting: Dermatology

## 2013-07-03 ENCOUNTER — Encounter (INDEPENDENT_AMBULATORY_CARE_PROVIDER_SITE_OTHER): Payer: Self-pay

## 2013-12-23 ENCOUNTER — Other Ambulatory Visit: Payer: Self-pay

## 2013-12-23 DIAGNOSIS — Z1231 Encounter for screening mammogram for malignant neoplasm of breast: Secondary | ICD-10-CM

## 2013-12-30 ENCOUNTER — Ambulatory Visit
Admission: RE | Admit: 2013-12-30 | Discharge: 2013-12-30 | Disposition: A | Discharge status: Home / Self Care | Payer: 59 | Source: Ambulatory Visit

## 2013-12-30 ENCOUNTER — Encounter (INDEPENDENT_AMBULATORY_CARE_PROVIDER_SITE_OTHER): Payer: Self-pay

## 2013-12-30 DIAGNOSIS — Z1231 Encounter for screening mammogram for malignant neoplasm of breast: Secondary | ICD-10-CM

## 2014-01-06 ENCOUNTER — Ambulatory Visit: Payer: Managed Care, Other (non HMO)

## 2014-07-16 IMAGING — US US CAROTID DUPLEX BILAT
1 series · 13 of 24 positions shown · non-contrast
Comparison: Prior thyroid ultrasound 07/25/2010

CLINICAL DATA: Chronic headaches, visual disturbance

BILATERAL CAROTID DUPLEX ULTRASOUND
TECHNIQUE: Gray scale imaging, color Doppler and duplex ultrasound
were performed of bilateral carotid and vertebral arteries in the
neck.

[Series 1: us carotid duplex bilat · 0.08mm/px · 13 of 53 slices shown]
[im 1/53]
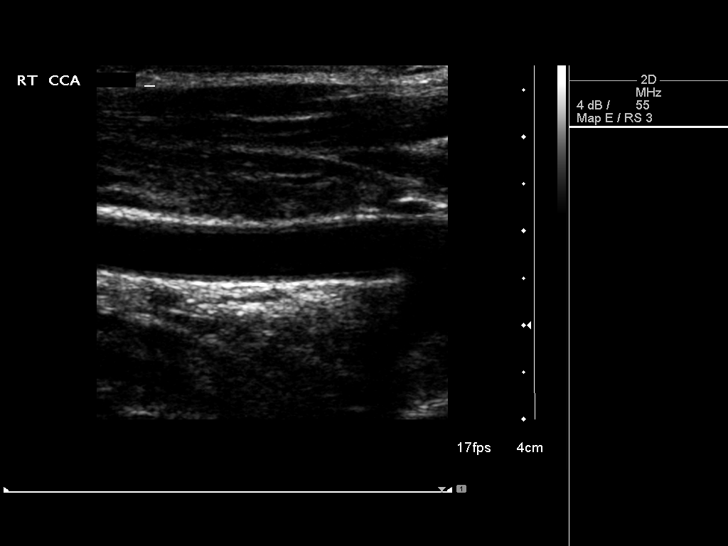
[im 5/53]
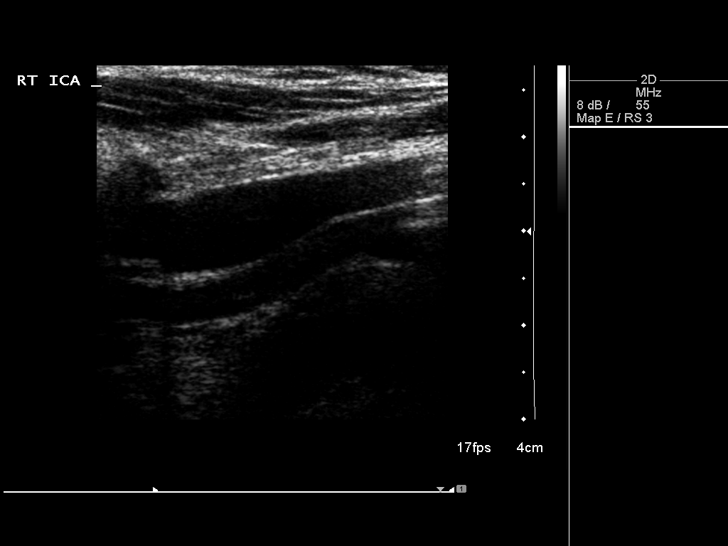
[im 10/53]
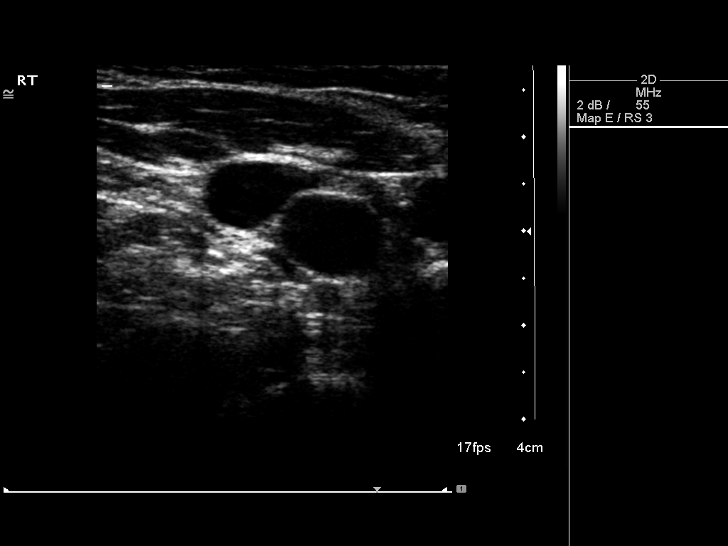
[im 14/53]
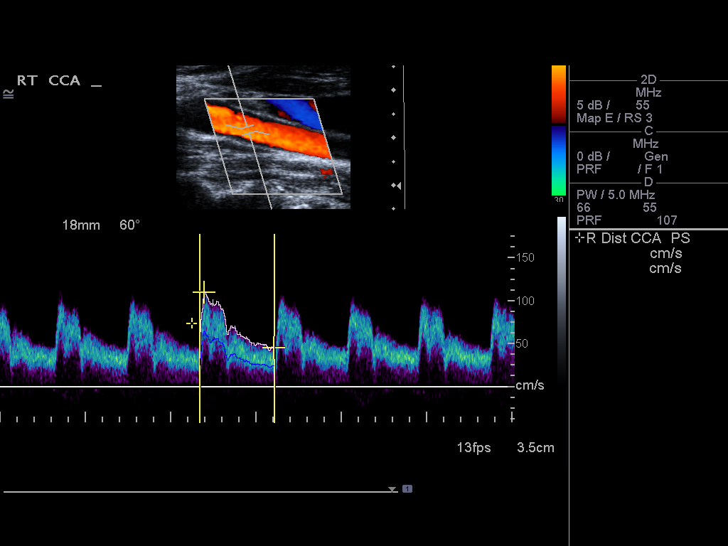
[im 19/53]
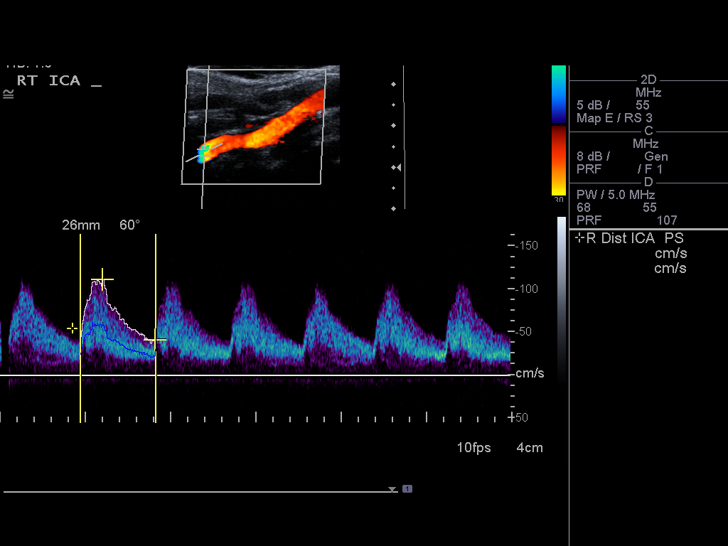
[im 23/53]
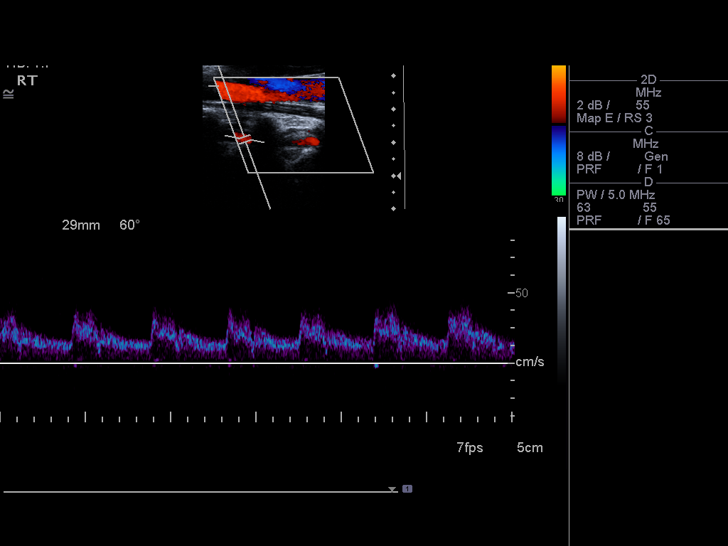
[im 28/53]
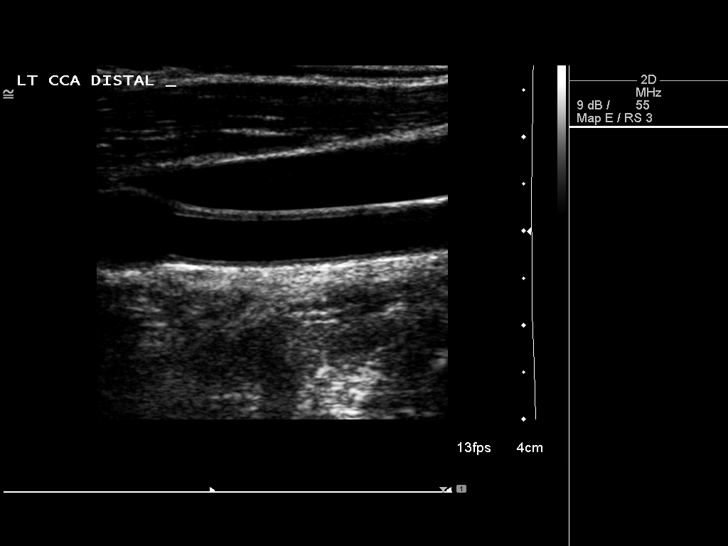
[im 30/53]
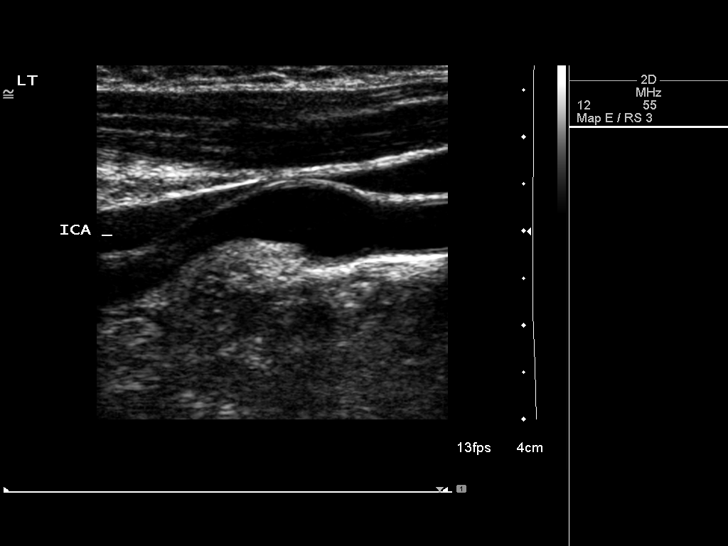
[im 34/53]
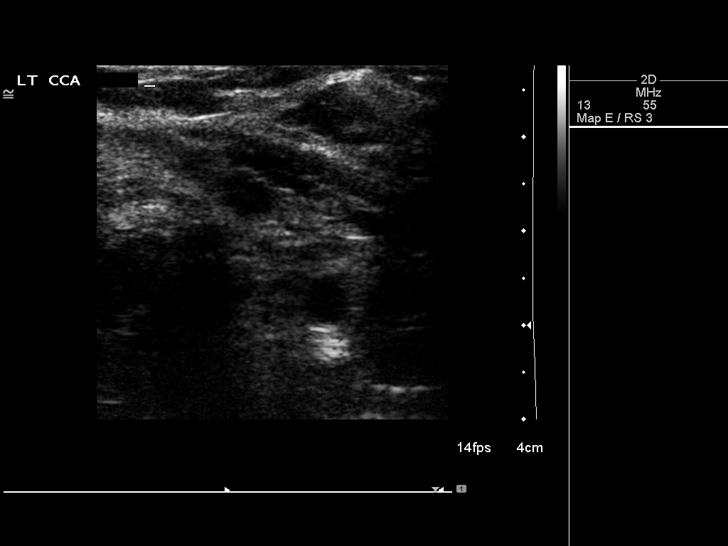
[im 39/53]
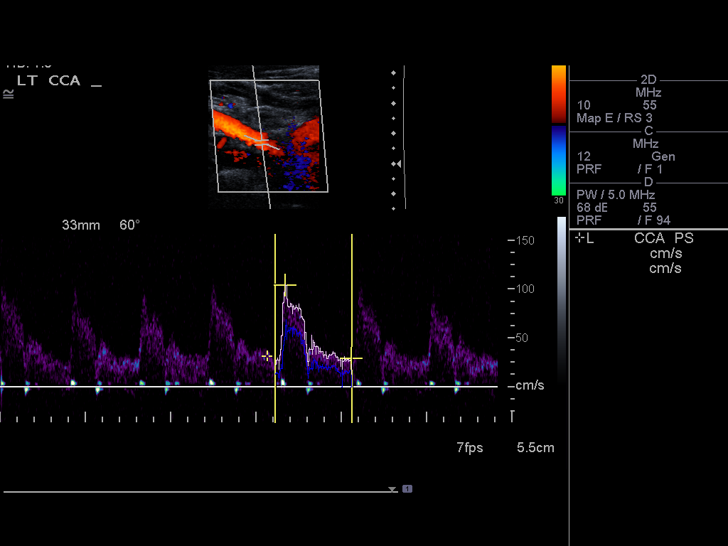
[im 43/53]
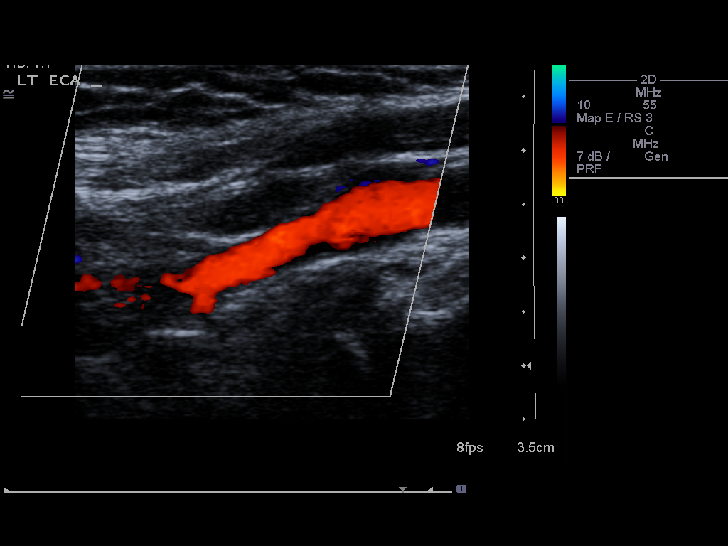
[im 48/53]
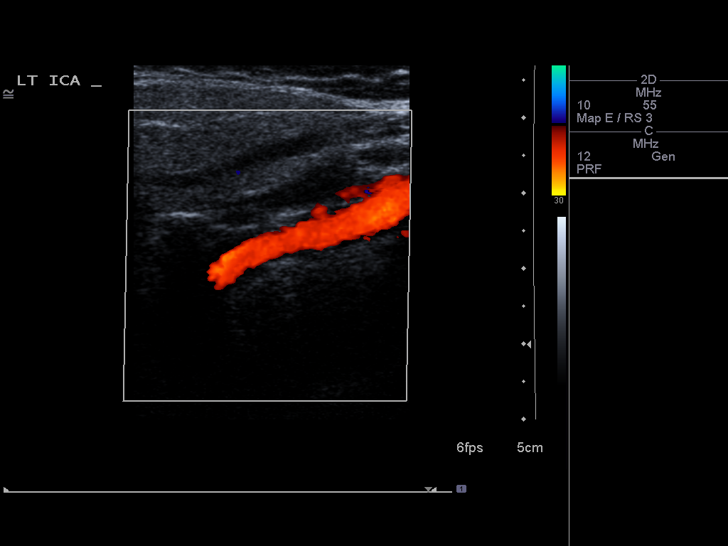
[im 53/53]
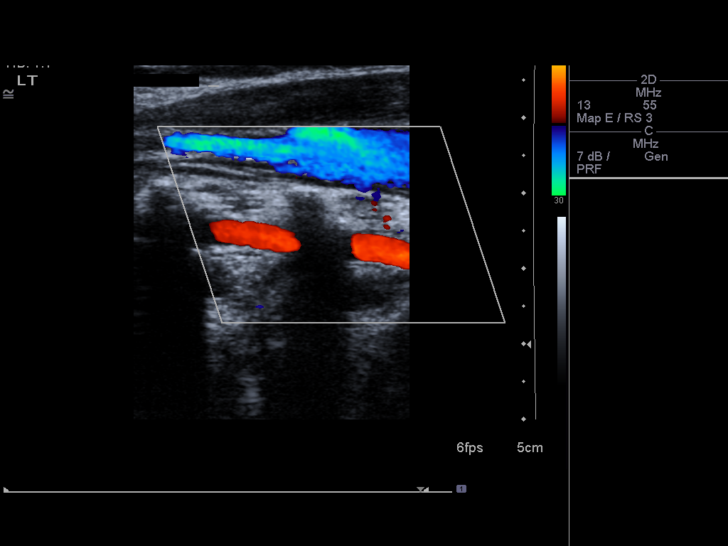

[13 of 24 positions shown; findings below may reference images not displayed]

Criteria:  Quantification of carotid stenosis is based on velocity
parameters that correlate the residual internal carotid diameter
with NASCET-based stenosis levels, using the diameter of the distal
internal carotid lumen as the denominator for stenosis measurement.

The following velocity measurements were obtained:

                 PEAK SYSTOLIC/END DIASTOLIC
RIGHT
ICA:                        111/41cm/sec
CCA:                        113/38cm/sec
SYSTOLIC ICA/CCA RATIO:
DIASTOLIC ICA/CCA RATIO:
ECA:                        77cm/sec

LEFT
ICA:                        103/42cm/sec
CCA:                        104/29cm/sec
SYSTOLIC ICA/CCA RATIO:
DIASTOLIC ICA/CCA RATIO:
ECA:                        92cm/sec
FINDINGS: RIGHT CAROTID ARTERY: The trace atherosclerotic plaque in the
carotid bifurcation.  No evidence of significant stenosis by gray
scale, color Doppler, pulsed Doppler or spectral waveform analysis.

RIGHT VERTEBRAL ARTERY:  Patent with normal antegrade flow.

LEFT CAROTID ARTERY: Trace heterogeneous plaque in the carotid bulb
and proximal internal carotid artery without significant stenosis
by gray scale, color Doppler, pulsed Doppler or spectral waveform
analysis.

LEFT VERTEBRAL ARTERY:  Patent with normal antegrade flow.
IMPRESSION: 1.  Trace atherosclerotic plaque bilaterally without significant
internal carotid artery stenosis.

2.  Vertebral arteries are patent with normal antegrade flow
bilaterally.

## 2015-07-02 DIAGNOSIS — J309 Allergic rhinitis, unspecified: Secondary | ICD-10-CM | POA: Insufficient documentation

## 2015-07-02 DIAGNOSIS — F419 Anxiety disorder, unspecified: Secondary | ICD-10-CM | POA: Insufficient documentation

## 2015-10-27 DIAGNOSIS — E039 Hypothyroidism, unspecified: Secondary | ICD-10-CM | POA: Insufficient documentation

## 2015-11-27 DIAGNOSIS — M7918 Myalgia, other site: Secondary | ICD-10-CM | POA: Insufficient documentation

## 2015-11-27 DIAGNOSIS — G5603 Carpal tunnel syndrome, bilateral upper limbs: Secondary | ICD-10-CM | POA: Insufficient documentation

## 2015-11-27 DIAGNOSIS — H60542 Acute eczematoid otitis externa, left ear: Secondary | ICD-10-CM | POA: Insufficient documentation

## 2016-01-31 ENCOUNTER — Ambulatory Visit (INDEPENDENT_AMBULATORY_CARE_PROVIDER_SITE_OTHER)

## 2016-01-31 ENCOUNTER — Ambulatory Visit (INDEPENDENT_AMBULATORY_CARE_PROVIDER_SITE_OTHER): Admitting: Physician Assistant

## 2016-01-31 VITALS — BP 130/80 | HR 97 | Temp 98.4°F | Resp 17 | Ht 62.0 in | Wt 138.0 lb

## 2016-01-31 DIAGNOSIS — M778 Other enthesopathies, not elsewhere classified: Secondary | ICD-10-CM | POA: Diagnosis not present

## 2016-01-31 DIAGNOSIS — T148XXA Other injury of unspecified body region, initial encounter: Secondary | ICD-10-CM

## 2016-01-31 DIAGNOSIS — T148 Other injury of unspecified body region: Secondary | ICD-10-CM | POA: Diagnosis not present

## 2016-01-31 DIAGNOSIS — M79642 Pain in left hand: Secondary | ICD-10-CM | POA: Diagnosis not present

## 2016-01-31 DIAGNOSIS — X503XXA Overexertion from repetitive movements, initial encounter: Secondary | ICD-10-CM

## 2016-01-31 DIAGNOSIS — M779 Enthesopathy, unspecified: Principal | ICD-10-CM

## 2016-01-31 MED ORDER — MELOXICAM 15 MG PO TABS: 15.0000 mg | ORAL_TABLET | Freq: Every day | ORAL | 0 refills | Status: DC

## 2016-01-31 NOTE — Patient Instructions (Addendum)
     IF you received an x-ray today, you will receive an invoice from [facility]. Please contact [facility] at [phone] with questions or concerns regarding your invoice.   IF you received labwork today, you will receive an invoice from [facility]. Please contact [facility] at [phone] with questions or concerns regarding your invoice.   Our billing staff will not be able to assist you with questions regarding bills from these companies.  You will be contacted with the lab results as soon as they are available. The fastest way to get your results is to activate your My Chart account. Instructions are located on the last page of this paperwork. If you have not heard from us regarding the results in 2 weeks, please contact this office.     We recommend that you schedule a mammogram for breast cancer screening. Typically, you do not need a referral to do this. Please contact a local imaging center to schedule your mammogram.  [facility] - [phone]  *ask for the Radiology Department [facility]) - [phone] or [phone]  [facility] - [phone] [facility] - [phone] [facility] - [phone]  *ask for the Radiology Department [facility] - [phone]  *ask for the Radiology Department [facility] - [phone]  *ask for the Mammography Department [facility] - [phone]  

## 2016-02-01 ENCOUNTER — Other Ambulatory Visit: Payer: Self-pay | Admitting: Family Medicine

## 2016-02-01 DIAGNOSIS — R1013 Epigastric pain: Secondary | ICD-10-CM

## 2016-02-01 DIAGNOSIS — K219 Gastro-esophageal reflux disease without esophagitis: Secondary | ICD-10-CM

## 2016-02-02 NOTE — Progress Notes (Addendum)
   02/02/2016 9:57 AM   DOB: 05-11-1966 / MRN: [id number]  SUBJECTIVE:  Tamara Mccoy is a 50 y.o. female presenting for left side thenar eminence pain and tenderness, made worse with pincer grasping.  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  She denies weakness, paresthesia, temperature changes in the hand.  She has tried Aleve with some relief.  She has not tried immobilization.    Review of Systems  Constitutional: Negative for chills and fever.  Musculoskeletal: Positive for joint pain and myalgias. Negative for falls.  Neurological: Negative for tingling, tremors, sensory change and weakness.    The problem list and medications were reviewed and updated by myself where necessary and exist elsewhere in the encounter.   OBJECTIVE:  BP 130/80 (BP Location: Right Arm, Patient Position: Sitting, Cuff Size: Normal)   Pulse 97   Temp 98.4 F (36.9 C) (Oral)   Resp 17   Ht 5\' 2"  (1.575 m)   Wt 138 lb (62.6 kg)   LMP 01/06/2016   SpO2 99%   BMI 25.24 kg/m   Physical Exam  Constitutional: Vital signs are normal.  Musculoskeletal: She exhibits tenderness (left thenar emenience, negative phalen's and tinnels at the carpal tunnel. Negative finkelstien's.). She exhibits no edema.  Vitals reviewed.  CLINICAL DATA:  Workmen's comp, thenar eminence tenderness, no history of trauma  EXAM: LEFT HAND - 2 VIEW  COMPARISON:  None.  FINDINGS: There is no evidence of fracture or dislocation. There is no evidence of arthropathy or other focal bone abnormality. Soft tissues are unremarkable.  IMPRESSION: Negative.   Electronically Signed   By: Donavan Foil M.D.   On: 01/31/2016 18:25   ASSESSMENT AND PLAN  Tamara Mccoy was seen today for finger injury.  Diagnoses and all orders for this visit:  Left hand tendonitis Comments: Caused by probelm 2.  I  have placed her in a soft removable thumb spica splint.  She may return to work however must wear her splint.  Mobic qam. RTC 7 days  Overuse injury: See HPI.  Hand pain, left -     DG Hand 2 View Left; Future -     meloxicam (MOBIC) 15 MG tablet; Take 1 tablet (15 mg total) by mouth daily.     The patient is advised to call or return to clinic if she does not see an improvement in symptoms, or to seek the care of the closest emergency department if she worsens with the above plan.   Philis Fendt, MHS, PA-C [facility] 02/02/2016 9:57 AM   Addendum: [name] comp requesting more specifics in assess plan.  Hopefully this clarifies her clinical picture. Philis Fendt, MS, PA-C 6:59 PM, 04/26/2016

## 2016-02-08 ENCOUNTER — Ambulatory Visit (INDEPENDENT_AMBULATORY_CARE_PROVIDER_SITE_OTHER): Admitting: Family Medicine

## 2016-02-08 VITALS — BP 122/72 | HR 88 | Temp 98.2°F | Resp 17 | Ht 62.5 in | Wt 138.0 lb

## 2016-02-08 DIAGNOSIS — M79645 Pain in left finger(s): Secondary | ICD-10-CM | POA: Diagnosis not present

## 2016-02-08 DIAGNOSIS — M6281 Muscle weakness (generalized): Secondary | ICD-10-CM

## 2016-02-08 DIAGNOSIS — R29898 Other symptoms and signs involving the musculoskeletal system: Secondary | ICD-10-CM

## 2016-02-08 DIAGNOSIS — R531 Weakness: Secondary | ICD-10-CM | POA: Diagnosis not present

## 2016-02-08 NOTE — Patient Instructions (Addendum)
  Okay to continue Aleve over-the-counter twice per day as needed with food. Continue brace for your hand and I will refer you to the hand surgeon. Make sure you come out of your brace a few times per day for range of motion exercises.  Return to the clinic or go to the nearest emergency room if any of your symptoms worsen or new symptoms occur.     IF you received an x-ray today, you will receive an invoice from [facility]. Please contact [facility] at [phone] with questions or concerns regarding your invoice.   IF you received labwork today, you will receive an invoice from [facility]. Please contact [facility] at [phone] with questions or concerns regarding your invoice.   Our billing staff will not be able to assist you with questions regarding bills from these companies.  You will be contacted with the lab results as soon as they are available. The fastest way to get your results is to activate your My Chart account. Instructions are located on the last page of this paperwork. If you have not heard from [place] regarding the results in 2 weeks, please contact this office.     * Patient in our office from 10:22 AM through 12:22 PM.

## 2016-02-08 NOTE — Progress Notes (Signed)
By signing my name below I, Tereasa Coop, attest that this documentation has been prepared under the direction and in the presence of Wendie Agreste, MD. Electonically Signed. Tereasa Coop, Scribe 02/08/2016 at 12:11 PM  Subjective:    Patient ID: Tamara Mccoy, female    DOB: December 31, 1965, 50 y.o.   MRN: [id number]  Chief Complaint  Patient presents with  . Wrist Injury    left side     HPI Tamara Mccoy is a 50 y.o. female who presents to the [facility] for workers comp follow up for Progressive pain in the thenar eminence of the left hand that started at work. Pt also reports limited ROM in the left thumb due to pain. Pt is rt hand dominant.   Pain started one month prior to last visit on 01/31/16. Pain was intermittent for that month and relieved with aleve. (DOI first part of August). On day pt was seen for initial workers comp eval at [facility] on 01/31/16, pt had sudden loss of use and limited ROM lf left thumb. At time of last visit pt was reporting minimal relief with aleve. Thought to be overuse injury and placed in a thumb spica splint. Pt prescribed mobic 15mg  QD. Advised to wear the wrist brace at work.   Pt state that she took Mobic twice and it bothered her stomach, so she switched back to taking aleve. Pt reports taking 2 aleve at a time. Pain has not significantly improved, and still difficulty using thumb. No significant change in symptoms since last visit.  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     Patient Active Problem List   Diagnosis Date Noted  . Cervical dystonia 03/11/2013  . CONSTIPATION, CHRONIC 08/28/2007  . GESTATIONAL DIABETES 08/28/2007   Past Medical History:  Diagnosis Date  . Anxiety   . Hypothyroidism   . IBS (irritable bowel syndrome)   . Migraine    Past Surgical History:  Procedure Laterality Date  . WISDOM TOOTH EXTRACTION     Allergies  Allergen  Reactions  . Erythromycin   . Iohexol      Code: HIVES, Desc: Hives on face post injection of 100cc's Omni., Onset Date: [id number]   . Zithromax [Azithromycin]    Prior to Admission medications   Medication Sig Start Date End Date Taking? Authorizing Provider  esomeprazole (NEXIUM) 20 MG capsule Take 20 mg by mouth daily at 12 noon.   Yes Historical Provider, MD  levothyroxine (SYNTHROID, LEVOTHROID) 88 MCG tablet  03/01/13  Yes Historical Provider, MD  meloxicam (MOBIC) 15 MG tablet Take 1 tablet (15 mg total) by mouth daily. 01/31/16  Yes Tereasa Coop, PA-C  rizatriptan (MAXALT-MLT) 10 MG disintegrating tablet  02/21/13  Yes Historical Provider, MD   Social History   Social History  . Marital status: Married    Spouse name: Octavia Bruckner  . Number of children: 3  . Years of education: 14   Occupational History  .  [employer]   Social History Main Topics  . Smoking status: Never Smoker  . Smokeless tobacco: Never Used  . Alcohol use Yes     Comment: moderate  . Drug use: No  . Sexual activity: Not on file   Other Topics Concern  . Not on file   Social History Narrative   Patient lives at home with husband Octavia Bruckner and one son.    Patient  has 3 children.    Patient has some college.    Patient works at [employer].       Review of Systems  Musculoskeletal:       Positive for left thumb pain  Skin: Negative for rash.       Objective:   Physical Exam  Constitutional: She is oriented to person, place, and time. She appears well-developed and well-nourished. No distress.  HENT:  Head: Normocephalic and atraumatic.  Eyes: Conjunctivae are normal. Pupils are equal, round, and reactive to light.  Neck: Neck supple.  Cardiovascular: Normal rate.   Pulmonary/Chest: Effort normal.  Musculoskeletal: Normal range of motion.  Left upper extremity exam: No rash, no soft tissue swelling, possible slight atrophy of left thenar eminence. There is tenderness along the 1st left volar  metacarpal.  Pain with thumb flexion and adduction  Extension and abduction intact Negative tinel's test at wrist.  Negative phalen's test  Negative finkelstein   Neurological: She is alert and oriented to person, place, and time.  Skin: Skin is warm and dry.  Psychiatric: She has a normal mood and affect. Her behavior is normal.  Nursing note and vitals reviewed.   Vitals:   02/08/16 1028  BP: 122/72  Pulse: 88  Resp: 17  Temp: 98.2 F (36.8 C)  TempSrc: Oral  SpO2: 98%  Weight: 138 lb (62.6 kg)  Height: 5' 2.5" (1.588 m)         Assessment & Plan:  Tamara Mccoy is a 50 y.o. female Pain of left thumb - Plan: Ambulatory referral to Hand Surgery  Thumb weakness - Plan: Ambulatory referral to Hand Surgery  Initially thought to be overuse injury, but possible carpal tunnel or recurrent median nerve issue. We'll continue in thumb spica splint, range of motion throughout the day as possible, over-the-counter Aleve, and refer to hand surgery for further evaluation. Note provided for work. RTC precautions.  No orders of the defined types were placed in this encounter.  Patient Instructions    Okay to continue Aleve over-the-counter twice per day as needed with food. Continue brace for your hand and I will refer you to the hand surgeon. Make sure you come out of your brace a few times per day for range of motion exercises.  Return to the clinic or go to the nearest emergency room if any of your symptoms worsen or new symptoms occur.   IF you received an x-ray today, you will receive an invoice from [facility]. Please contact [facility] at [phone] with questions or concerns regarding your invoice.   IF you received labwork today, you will receive an invoice from [facility]. Please contact [facility] at [phone] with questions or concerns regarding your invoice.   Our billing staff will not be able to assist you with  questions regarding bills from these companies.  You will be contacted with the lab results as soon as they are available. The fastest way to get your results is to activate your My Chart account. Instructions are located on the last page of this paperwork. If you have not heard from [place] regarding the results in 2 weeks, please contact this office.        I personally performed the services described in this documentation, which was scribed in my presence. The recorded information has been reviewed and considered, and addended by me as needed.   Signed,   Merri Ray, MD [facility].  02/08/16 12:18 PM

## 2016-02-18 ENCOUNTER — Ambulatory Visit
Admission: RE | Admit: 2016-02-18 | Discharge: 2016-02-18 | Disposition: A | Discharge status: Home / Self Care | Payer: 59 | Source: Ambulatory Visit | Attending: Family Medicine | Admitting: Family Medicine

## 2016-02-18 DIAGNOSIS — K219 Gastro-esophageal reflux disease without esophagitis: Secondary | ICD-10-CM

## 2016-02-18 DIAGNOSIS — R1013 Epigastric pain: Secondary | ICD-10-CM

## 2016-04-26 ENCOUNTER — Other Ambulatory Visit: Payer: Self-pay | Admitting: Physician Assistant

## 2016-04-26 NOTE — Progress Notes (Signed)
Reviewed addendum, notes by Mr. Carlis Abbott. Agree with his note.

## 2016-06-15 ENCOUNTER — Encounter: Payer: Self-pay | Admitting: Internal Medicine

## 2016-06-15 ENCOUNTER — Encounter (INDEPENDENT_AMBULATORY_CARE_PROVIDER_SITE_OTHER): Payer: Self-pay

## 2016-06-15 ENCOUNTER — Ambulatory Visit (INDEPENDENT_AMBULATORY_CARE_PROVIDER_SITE_OTHER): Payer: 59 | Admitting: Internal Medicine

## 2016-06-15 VITALS — BP 122/80 | HR 82 | Temp 98.2°F | Ht 62.0 in | Wt 135.0 lb

## 2016-06-15 DIAGNOSIS — E039 Hypothyroidism, unspecified: Secondary | ICD-10-CM | POA: Diagnosis not present

## 2016-06-15 DIAGNOSIS — M79651 Pain in right thigh: Secondary | ICD-10-CM

## 2016-06-15 DIAGNOSIS — R0789 Other chest pain: Secondary | ICD-10-CM | POA: Diagnosis not present

## 2016-06-15 DIAGNOSIS — M79652 Pain in left thigh: Secondary | ICD-10-CM

## 2016-06-15 DIAGNOSIS — G43C1 Periodic headache syndromes in child or adult, intractable: Secondary | ICD-10-CM

## 2016-06-15 DIAGNOSIS — F419 Anxiety disorder, unspecified: Secondary | ICD-10-CM

## 2016-06-15 DIAGNOSIS — K589 Irritable bowel syndrome without diarrhea: Secondary | ICD-10-CM | POA: Insufficient documentation

## 2016-06-15 DIAGNOSIS — F32A Depression, unspecified: Secondary | ICD-10-CM | POA: Insufficient documentation

## 2016-06-15 DIAGNOSIS — K580 Irritable bowel syndrome with diarrhea: Secondary | ICD-10-CM | POA: Diagnosis not present

## 2016-06-15 DIAGNOSIS — F329 Major depressive disorder, single episode, unspecified: Secondary | ICD-10-CM | POA: Insufficient documentation

## 2016-06-15 MED ORDER — BUPROPION HCL ER (XL) 150 MG PO TB24: 150.0000 mg | ORAL_TABLET | Freq: Every day | ORAL | 2 refills | Status: DC

## 2016-06-15 NOTE — Patient Instructions (Signed)
Generalized Anxiety Disorder Generalized anxiety disorder (GAD) is a mental disorder. It interferes with life functions, including relationships, work, and school. GAD is different from normal anxiety, which everyone experiences at some point in their lives in response to specific life events and activities. Normal anxiety actually helps us prepare for and get through these life events and activities. Normal anxiety goes away after the event or activity is over.  GAD causes anxiety that is not necessarily related to specific events or activities. It also causes excess anxiety in proportion to specific events or activities. The anxiety associated with GAD is also difficult to control. GAD can vary from mild to severe. People with severe GAD can have intense waves of anxiety with physical symptoms (panic attacks).  SYMPTOMS The anxiety and worry associated with GAD are difficult to control. This anxiety and worry are related to many life events and activities and also occur more days than not for 6 months or longer. People with GAD also have three or more of the following symptoms (one or more in children):  Restlessness.   Fatigue.  Difficulty concentrating.   Irritability.  Muscle tension.  Difficulty sleeping or unsatisfying sleep. DIAGNOSIS GAD is diagnosed through an assessment by your health care provider. Your health care provider will ask you questions aboutyour mood,physical symptoms, and events in your life. Your health care provider may ask you about your medical history and use of alcohol or drugs, including prescription medicines. Your health care provider may also do a physical exam and blood tests. Certain medical conditions and the use of certain substances can cause symptoms similar to those associated with GAD. Your health care provider may refer you to a mental health specialist for further evaluation. TREATMENT The following therapies are usually used to treat GAD:    Medication. Antidepressant medication usually is prescribed for long-term daily control. Antianxiety medicines may be added in severe cases, especially when panic attacks occur.   Talk therapy (psychotherapy). Certain types of talk therapy can be helpful in treating GAD by providing support, education, and guidance. A form of talk therapy called cognitive behavioral therapy can teach you healthy ways to think about and react to daily life events and activities.  Stress managementtechniques. These include yoga, meditation, and exercise and can be very helpful when they are practiced regularly. A mental health specialist can help determine which treatment is best for you. Some people see improvement with one therapy. However, other people require a combination of therapies. This information is not intended to replace advice given to you by your health care provider. Make sure you discuss any questions you have with your health care provider. Document Released: 09/02/2012 Document Revised: 05/29/2014 Document Reviewed: 09/02/2012 Elsevier Interactive Patient Education  2017 Elsevier Inc.  

## 2016-06-15 NOTE — Assessment & Plan Note (Addendum)
Deteriorate Support offered today eRx for Wellbutrin 150 mg daily

## 2016-06-15 NOTE — Assessment & Plan Note (Signed)
Anxiety related May improve with starting Lexapro Will monitor

## 2016-06-15 NOTE — Progress Notes (Signed)
HPI  Pt presents to the clinic today to establish care and for management of the conditions listed below. She is transferring care from [facility] at [place].  Anxiety: Triggered by death of her mother, daughter's wedding and work stress. She is not taking anything for anxiety but she would like to be started on something for this. She has never been treated for this in the past. She denies depression, SI/HI.  Hypothyroidism: Her levels were last checked 10/2015  . She denies any issues on her current dose of Synthroid. She is concerned about a little weight gain and dry skin, but she reports these may not be related to her thyroid.   IBS: She reports diarrhea that occurs mainly when she is anxious. She does not take anything OTC for this.   Migraines: She reports she had a MRI in 2017 for evaluation of her migraines. She has not had a migraine since that time. She does not take anything for this at this time  She also c/o pain on both sides of her chest wall. This started 1 year ago. She was examined by Dr. Pamala Hurry and no further workup was planned. She reports she has pain on her inner thighs as well. She is concerned that the pain has persisted for so long and she is requesting further evaluation of this today.  Flu: 04/2016 Tetanus: 02/2009 Pap Smear: 12/2015 at [place]: 09/2015 Colon Screening: 2009, Dr. Olevia Perches Vision Screening: annually Dentist: biannually  Past Medical History:  Diagnosis Date  . Anxiety   . Hypothyroidism   . IBS (irritable bowel syndrome)   . Migraine     Current Outpatient Prescriptions  Medication Sig Dispense Refill  . esomeprazole (NEXIUM) 20 MG capsule Take 20 mg by mouth as needed.     Marland Kitchen levothyroxine (SYNTHROID, LEVOTHROID) 88 MCG tablet TAKE 1 TABLET BY MOUTH DAILY AS DIRECTED    . Multiple Vitamin (MULTIVITAMIN) tablet Take 1 tablet by mouth daily.    . Vitamin D, Cholecalciferol, 1000 units CAPS Take 1 capsule by mouth  daily.     No current facility-administered medications for this visit.     Allergies  Allergen Reactions  . Contrast Media [Iodinated Diagnostic Agents] Hives  . Erythromycin   . Iohexol      Code: HIVES, Desc: Hives on face post injection of 100cc's Omni., Onset Date: [id number]   . Zithromax [Azithromycin]   . Penicillins Rash    Family History  Problem Relation Age of Onset  . COPD Mother   . Kidney failure Mother     only one working   . Heart Problems Mother     stent   . Heart block Father   . Prostate cancer Father   . Leukemia Maternal Grandmother   . Cancer Maternal Grandmother     leukemia  . Heart attack Maternal Grandfather   . Heart Problems Paternal Grandmother     bypass  . Prostate cancer Paternal Grandfather   . Cancer Maternal Uncle     leukemia    Social History   Social History  . Marital status: Married    Spouse name: Octavia Bruckner  . Number of children: 3  . Years of education: 14   Occupational History  .  [employer]   Social History Main Topics  . Smoking status: Never Smoker  . Smokeless tobacco: Never Used  . Alcohol use Yes     Comment: moderate  . Drug use: No  . Sexual  activity: Not on file   Other Topics Concern  . Not on file   Social History Narrative   Patient lives at home with husband Octavia Bruckner and one son.    Patient has 3 children.    Patient has some college.    Patient works at [employer].     ROS:  Constitutional: Pt reports weight gain. Denies fever, malaise, fatigue, headache.  HEENT: Denies eye pain, eye redness, ear pain, ringing in the ears, wax buildup, runny nose, nasal congestion, bloody nose, or sore throat. Respiratory: Denies difficulty breathing, shortness of breath, cough or sputum production.   Cardiovascular: Denies chest pain, chest tightness, palpitations or swelling in the hands or feet.  Gastrointestinal: Pt reports intermittent diarrhea. Denies abdominal pain, bloating, constipation, or blood in the  stool.  GU: Denies frequency, urgency, pain with urination, blood in urine, odor or discharge. Musculoskeletal: Pt reports chest wall and inner thigh pain. Denies decrease in range of motion, difficulty with gait, or joint pain and swelling.  Skin: Denies redness, rashes, lesions or ulcercations.  Neurological: Denies dizziness, difficulty with memory, difficulty with speech or problems with balance and coordination.  Psych: Pt reports anxiety. Denies depression, SI/HI.  No other specific complaints in a complete review of systems (except as listed in HPI above).  PE:  BP 122/80   Pulse 82   Temp 98.2 F (36.8 C) (Oral)   Ht 5\' 2"  (1.575 m)   Wt 135 lb (61.2 kg)   SpO2 98%   BMI 24.69 kg/m   Wt Readings from Last 3 Encounters:  02/08/16 138 lb (62.6 kg)  01/31/16 138 lb (62.6 kg)  06/12/13 129 lb 12.8 oz (58.9 kg)    General: Appears her stated age, well developed, well nourished in NAD. Skin: Dry and intact. Neck: Neck supple, trachea midline. No masses, lumps or thyromegaly present.  Cardiovascular: Normal rate and rhythm.  Pulmonary/Chest: Normal effort and positive vesicular breath sounds. No respiratory distress. No wheezes, rales or ronchi noted.  Abdomen: Soft and nontender. Normal bowel sounds. No distention or masses noted.  Neurological: Alert and oriented.  Psychiatric: She is tearful at times during the interview. Thought content and judgement seem normal.   Assessment and Plan:  Chest wall pain and inner thigh pain:  Will defer to next visit  Migraines:   Resolved  RTC in 4 weeks, sooner if needed Webb Silversmith, NP

## 2016-06-15 NOTE — Assessment & Plan Note (Signed)
Will check TSH and T4 at next visit Continue current dose of Synthroid

## 2016-07-14 ENCOUNTER — Encounter: Payer: Self-pay | Admitting: Internal Medicine

## 2016-07-18 ENCOUNTER — Ambulatory Visit (INDEPENDENT_AMBULATORY_CARE_PROVIDER_SITE_OTHER): Payer: 59 | Admitting: Internal Medicine

## 2016-07-18 ENCOUNTER — Ambulatory Visit: Payer: 59 | Admitting: Internal Medicine

## 2016-07-18 ENCOUNTER — Encounter: Payer: Self-pay | Admitting: Internal Medicine

## 2016-07-18 VITALS — BP 120/80 | HR 82 | Temp 98.5°F | Wt 139.0 lb

## 2016-07-18 DIAGNOSIS — M791 Myalgia, unspecified site: Secondary | ICD-10-CM

## 2016-07-18 DIAGNOSIS — M25541 Pain in joints of right hand: Secondary | ICD-10-CM

## 2016-07-18 DIAGNOSIS — F419 Anxiety disorder, unspecified: Secondary | ICD-10-CM | POA: Diagnosis not present

## 2016-07-18 DIAGNOSIS — M25542 Pain in joints of left hand: Secondary | ICD-10-CM | POA: Diagnosis not present

## 2016-07-18 NOTE — Patient Instructions (Signed)
Myofascial Pain Syndrome and Fibromyalgia Myofascial pain syndrome and fibromyalgia are both pain disorders. This pain may be felt mainly in your muscles.  Myofascial pain syndrome:  Always has trigger points or tender points in the muscle that will cause pain when pressed. The pain may come and go.  Usually affects your neck, upper back, and shoulder areas. The pain often radiates into your arms and hands.  Fibromyalgia:  Has muscle pains and tenderness that come and go.  Is often associated with fatigue and sleep disturbances.  Has trigger points.  Tends to be long-lasting (chronic), but is not life-threatening. Fibromyalgia and myofascial pain are not the same. However, they often occur together. If you have both conditions, each can make the other worse. Both are common and can cause enough pain and fatigue to make day-to-day activities difficult. What are the causes? The exact causes of fibromyalgia and myofascial pain are not known. People with certain gene types may be more likely to develop fibromyalgia. Some factors can be triggers for both conditions, such as:  Spine disorders.  Arthritis.  Severe injury (trauma) and other physical stressors.  Being under a lot of stress.  A medical illness. What are the signs or symptoms? Fibromyalgia  The main symptom of fibromyalgia is widespread pain and tenderness in your muscles. This can vary over time. Pain is sometimes described as stabbing, shooting, or burning. You may have tingling or numbness, too. You may also have sleep problems and fatigue. You may wake up feeling tired and groggy (fibro fog). Other symptoms may include:  Bowel and bladder problems.  Headaches.  Visual problems.  Problems with odors and noises.  Depression or mood changes.  Painful menstrual periods (dysmenorrhea).  Dry skin or eyes. Myofascial pain syndrome  Symptoms of myofascial pain syndrome include:  Tight, ropy bands of  muscle.  Uncomfortable sensations in muscular areas, such as:  Aching.  Cramping.  Burning.  Numbness.  Tingling.  Muscle weakness.  Trouble moving certain muscles freely (range of motion). How is this diagnosed? There are no specific tests to diagnose fibromyalgia or myofascial pain syndrome. Both can be hard to diagnose because their symptoms are common in many other conditions. Your health care provider may suspect one or both of these conditions based on your symptoms and medical history. Your health care provider will also do a physical exam. The key to diagnosing fibromyalgia is having pain, fatigue, and other symptoms for more than three months that cannot be explained by another condition. The key to diagnosing myofascial pain syndrome is finding trigger points in muscles that are tender and cause pain elsewhere in your body (referred pain). How is this treated? Treating fibromyalgia and myofascial pain often requires a team of health care providers. This usually starts with your primary provider and a physical therapist. You may also find it helpful to work with alternative health care providers, such as massage therapists or acupuncturists. Treatment for fibromyalgia may include medicines. This may include nonsteroidal anti-inflammatory drugs (NSAIDs), along with other medicines. Treatment for myofascial pain may also include:  NSAIDs.  Cooling and stretching of muscles.  Trigger point injections.  Sound wave (ultrasound) treatments to stimulate muscles. Follow these instructions at home:  Take medicines only as directed by your health care provider.  Exercise as directed by your health care provider or physical therapist.  Try to avoid stressful situations.  Practice relaxation techniques to control your stress. You may want to try:  Biofeedback.  Visual imagery.  Hypnosis.    Muscle relaxation.  Yoga.  Meditation.  Talk to your health care provider  about alternative treatments, such as acupuncture or massage treatment.  Maintain a healthy lifestyle. This includes eating a healthy diet and getting enough sleep.  Consider joining a support group.  Do not do activities that stress or strain your muscles. That includes repetitive motions and heavy lifting. Where to find more information:  National Fibromyalgia Association: www.fmaware.org  Arthritis Foundation: www.arthritis.org  American Chronic Pain Association: www.theacpa.org/condition/myofascial-pain Contact a health care provider if:  You have new symptoms.  Your symptoms get worse.  You have side effects from your medicines.  You have trouble sleeping.  Your condition is causing depression or anxiety. This information is not intended to replace advice given to you by your health care provider. Make sure you discuss any questions you have with your health care provider. Document Released: 05/08/2005 Document Revised: 10/14/2015 Document Reviewed: 02/11/2014 Elsevier Interactive Patient Education  2017 Elsevier Inc.  

## 2016-07-18 NOTE — Progress Notes (Signed)
Subjective:    Patient ID: Tamara Mccoy, female    DOB: [birth year], 51 y.o.   MRN: [id number]  HPI  Pt presents to the clinic today to follow up anxiety. She is also here to discuss issues that we could not cover at her last visit.  Anxiety: Triggered by work stress, daughters wedding and death of her mother. She was started on Wellbutrin at her last visit. She has been taking the medication as prescribed and denies adverse side effects. She reports it has been working well for her. She feels like she can deal with things better. She has noticed very vivid dreams, but this is not really a concern for her.  She also c/o bilateral chest wall pain. This started about 1 year ago. She describes the pain as pressure. It is worse when you push on it, wearing a bra etc. She denies burning and tingling sensation. She did a mammogram which did not show anything. She reports her previous PCP also did an autoimmune workup. She has tried Ibuprofen and muscle relaxers with minimal relief.   She also c/o pain in her inner thighs bilaterally. She describes the pain as pressure. Movement does not affect the pain but she reports if her cat walks on her lap, the pressure seems to make the pain worse. She denies numbness or tingling in her legs. She has tried Ibuprofen and muscles relaxer's with minimal relief.  She also c/o bilateral thumb pain. This has been going on for > 3 months. She describes the pain as achy. She reports she had in injection in her left thumb for trigger finger. She was seeing a specialist for this because initially it was workman's comp, but then they denied her claim. She is taking Aleve daily with minimal relief.  Review of Systems  Past Medical History:  Diagnosis Date  . Anxiety   . History of chicken pox   . Hypothyroidism   . Migraine     Current Outpatient Prescriptions  Medication Sig Dispense Refill  . buPROPion (WELLBUTRIN XL) 150 MG 24 hr tablet Take 1 tablet (150  mg total) by mouth daily. 30 tablet 2  . levonorgestrel (MIRENA) 20 MCG/24HR IUD 1 each by Intrauterine route once. Inserted 02/2016    . levothyroxine (SYNTHROID, LEVOTHROID) 88 MCG tablet TAKE 1 TABLET BY MOUTH DAILY AS DIRECTED    . Multiple Vitamin (MULTIVITAMIN) tablet Take 1 tablet by mouth daily.    . Vitamin D, Cholecalciferol, 1000 units CAPS Take 1 capsule by mouth daily.     No current facility-administered medications for this visit.     Allergies  Allergen Reactions  . Contrast Media [Iodinated Diagnostic Agents] Hives  . Erythromycin   . Iohexol      Code: HIVES, Desc: Hives on face post injection of 100cc's Omni., Onset Date: [id number]   . Zithromax [Azithromycin]   . Penicillins Rash    Family History  Problem Relation Age of Onset  . COPD Mother   . Kidney failure Mother     only one working   . Heart Problems Mother     stent   . Uterine cancer Mother   . Heart block Father   . Prostate cancer Father   . Leukemia Maternal Grandmother   . Leukemia Maternal Uncle   . Heart attack Maternal Grandfather   . Heart Problems Paternal Grandmother     bypass  . Prostate cancer Paternal Grandfather     Social History   Social  History  . Marital status: Married    Spouse name: Octavia Bruckner  . Number of children: 3  . Years of education: 14   Occupational History  .  [employer]   Social History Main Topics  . Smoking status: Never Smoker  . Smokeless tobacco: Never Used  . Alcohol use Yes     Comment: occasional  . Drug use: No  . Sexual activity: Not Currently   Other Topics Concern  . Not on file   Social History Narrative   Patient lives at home with husband Octavia Bruckner and one son.    Patient has 3 children.    Patient has some college.    Patient works at [employer].      Constitutional: Denies fever, malaise, fatigue, headache or abrupt weight changes.  Musculoskeletal: Pt reports muscle and joint pain. Denies decrease in range of motion, difficulty with  gait, or joint swelling.  Neurological: Denies dizziness, difficulty with memory, difficulty with speech or problems with balance and coordination.  Psych: Pt reports anxiety. Denies depression, SI/HI.  No other specific complaints in a complete review of systems (except as listed in HPI above).     Objective:   Physical Exam   BP 120/80   Pulse 82   Temp 98.5 F (36.9 C) (Oral)   Wt 139 lb (63 kg)   SpO2 99%   BMI 25.42 kg/m  Wt Readings from Last 3 Encounters:  07/18/16 139 lb (63 kg)  06/15/16 135 lb (61.2 kg)  02/08/16 138 lb (62.6 kg)    General: Appears her stated age, well developed, well nourished in NAD. Cardiovascular: Normal rate and rhythm.  Pulmonary/Chest: Normal effort and positive vesicular breath sounds. No respiratory distress. No wheezes, rales or ronchi noted.  Musculoskeletal: Pain with palpation of the upper lateral chest wall bilaterally. Pain with palpation of the medial thighs bilaterally. No masses noted. Strength 5/5 BUE/BLE. Normal flexion and extension of thumbs bilaterally. Neurological: Alert and oriented. Sensation intact. Psychiatric: She is slightly anxious appearing today.        Assessment & Plan:   Anxiety:  Improved on Wellbutrin Let me know if vivid dreams do not improve Will monitor for now  Multiple muscle pain, joint pain in hands:  ?Myofacial pain syndrome Failed NSAIDS and muscle relaxers Will refer to rheum for further evaluation- see Rosaria Ferries on the way out to schedule  RTC as needed or if symptoms persist or worsen Webb Silversmith, NP

## 2016-07-21 ENCOUNTER — Ambulatory Visit: Payer: 59 | Admitting: Internal Medicine

## 2016-09-15 ENCOUNTER — Other Ambulatory Visit: Payer: Self-pay | Admitting: Internal Medicine

## 2016-09-29 ENCOUNTER — Encounter: Payer: Self-pay | Admitting: Family Medicine

## 2016-09-29 ENCOUNTER — Ambulatory Visit (INDEPENDENT_AMBULATORY_CARE_PROVIDER_SITE_OTHER): Payer: 59 | Admitting: Family Medicine

## 2016-09-29 VITALS — BP 132/82 | HR 96 | Temp 98.6°F | Wt 133.8 lb

## 2016-09-29 DIAGNOSIS — J069 Acute upper respiratory infection, unspecified: Secondary | ICD-10-CM | POA: Diagnosis not present

## 2016-09-29 DIAGNOSIS — J029 Acute pharyngitis, unspecified: Secondary | ICD-10-CM

## 2016-09-29 LAB — POCT RAPID STREP A (OFFICE): Rapid Strep A Screen: NEGATIVE

## 2016-09-29 NOTE — Progress Notes (Signed)
Subjective:    Patient ID: Tamara Mccoy, female    DOB: 1965-07-03, 51 y.o.   MRN: [id number]  HPI This is a 51 yo female who presents today with cough, sore throat and fever that started yesterday. Body aches. Non productive cough. No SOB or wheeze. Feels like she has post nasal drainage. Ibuprofen 400 mg this morning. Subjective fever, improved with ibuprofen and "allergy pill." Gets strep throat easily, has [place]    Past Medical History:  Diagnosis Date  . Anxiety   . History of chicken pox   . Hypothyroidism   . Migraine    Past Surgical History:  Procedure Laterality Date  . WISDOM TOOTH EXTRACTION     Family History  Problem Relation Age of Onset  . COPD Mother   . Kidney failure Mother        only one working   . Heart Problems Mother        stent   . Uterine cancer Mother   . Heart block Father   . Prostate cancer Father   . Leukemia Maternal Grandmother   . Leukemia Maternal Uncle   . Heart attack Maternal Grandfather   . Heart Problems Paternal Grandmother        bypass  . Prostate cancer Paternal Grandfather    Social History  Substance Use Topics  . Smoking status: Never Smoker  . Smokeless tobacco: Never Used  . Alcohol use Yes     Comment: occasional      Review of Systems  Constitutional: Positive for fever (subjective).  HENT: Positive for congestion, postnasal drip, rhinorrhea and sore throat.   Respiratory: Positive for cough (non productive). Negative for shortness of breath and wheezing.        Objective:   Physical Exam  Constitutional: She is oriented to person, place, and time. She appears well-developed and well-nourished. No distress.  HENT:  Head: Normocephalic and atraumatic.  Right Ear: Tympanic membrane, external ear and ear canal normal.  Left Ear: Tympanic membrane, external ear and ear canal normal.  Nose: Mucosal edema and rhinorrhea present. Right sinus exhibits no  maxillary sinus tenderness and no frontal sinus tenderness. Left sinus exhibits no maxillary sinus tenderness and no frontal sinus tenderness.  Mouth/Throat: Uvula is midline. Posterior oropharyngeal erythema present. No oropharyngeal exudate or posterior oropharyngeal edema.  Eyes: Conjunctivae are normal.  Neck: Normal range of motion. Neck supple.  Cardiovascular: Normal rate, regular rhythm and normal heart sounds.   Pulmonary/Chest: Effort normal and breath sounds normal.  Lymphadenopathy:    She has no cervical adenopathy.  Neurological: She is alert and oriented to person, place, and time.  Skin: Skin is warm and dry. She is not diaphoretic.  Psychiatric: She has a normal mood and affect. Her behavior is normal. Judgment and thought content normal.  Vitals reviewed.     BP 132/82 (BP Location: Right Arm, Patient Position: Sitting, Cuff Size: Normal)   Pulse 96   Temp 98.6 F (37 C) (Oral)   Wt 133 lb 12.8 oz (60.7 kg)   SpO2 99%   BMI 24.47 kg/m  Wt Readings from Last 3 Encounters:  09/29/16 133 lb 12.8 oz (60.7 kg)  07/18/16 139 lb (63 kg)  06/15/16 135 lb (61.2 kg)   Results for orders placed or performed in visit on 09/29/16  POCT rapid strep A  Result Value Ref Range   Rapid Strep A Screen Negative Negative  Assessment & Plan:  1. Pharyngitis, unspecified etiology - POCT rapid strep A- negatvie  2. Viral URI - Provided written and verbal information regarding diagnosis and treatment. - symptomatic treatment and RTC precautions reveiwed   Clarene Reamer, FNP-BC  [facility] [place]  09/29/2016 3:06 PM

## 2016-09-29 NOTE — Patient Instructions (Signed)
Continue ibuprofen 2 tablets every 8 to 12 hours for pain/fever Drink enough fluids to make your urine light yellow Can take Delsym over the counter for cough Can continue allergy medication to help dry up nasal drainage  Viral Respiratory Infection A respiratory infection is an illness that affects part of the respiratory system, such as the lungs, nose, or throat. Most respiratory infections are caused by either viruses or bacteria. A respiratory infection that is caused by a virus is called a viral respiratory infection. Common types of viral respiratory infections include:  A cold.  The flu (influenza).  A respiratory syncytial virus (RSV) infection. How do I know if I have a viral respiratory infection? Most viral respiratory infections cause:  A stuffy or runny nose.  Yellow or green nasal discharge.  A cough.  Sneezing.  Fatigue.  Achy muscles.  A sore throat.  Sweating or chills.  A fever.  A headache. How are viral respiratory infections treated? If influenza is diagnosed early, it may be treated with an antiviral medicine that shortens the length of time a person has symptoms. Symptoms of viral respiratory infections may be treated with over-the-counter and prescription medicines, such as:  Expectorants. These make it easier to cough up mucus.  Decongestant nasal sprays. Health care providers do not prescribe antibiotic medicines for viral infections. This is because antibiotics are designed to kill bacteria. They have no effect on viruses. How do I know if I should stay home from work or school? To avoid exposing others to your respiratory infection, stay home if you have:  A fever.  A persistent cough.  A sore throat.  A runny nose.  Sneezing.  Muscles aches.  Headaches.  Fatigue.  Weakness.  Chills.  Sweating.  Nausea. Follow these instructions at home:  Rest as much as possible.  Take over-the-counter and prescription medicines  only as told by your health care provider.  Drink enough fluid to keep your urine clear or pale yellow. This helps prevent dehydration and helps loosen up mucus.  Gargle with a salt-water mixture 3-4 times per day or as needed. To make a salt-water mixture, completely dissolve -1 tsp of salt in 1 cup of warm water.  Use nose drops made from salt water to ease congestion and soften raw skin around your nose.  Do not drink alcohol.  Do not use tobacco products, including cigarettes, chewing tobacco, and e-cigarettes. If you need help quitting, ask your health care provider. Contact a health care provider if:  Your symptoms last for 10 days or longer.  Your symptoms get worse over time.  You have a fever.  You have severe sinus pain in your face or forehead.  The glands in your jaw or neck become very swollen. Get help right away if:  You feel pain or pressure in your chest.  You have shortness of breath.  You faint or feel like you will faint.  You have severe and persistent vomiting.  You feel confused or disoriented. This information is not intended to replace advice given to you by your health care provider. Make sure you discuss any questions you have with your health care provider. Document Released: 02/15/2005 Document Revised: 10/14/2015 Document Reviewed: 10/14/2014 Elsevier Interactive Patient Education  2017 [employer].

## 2016-10-02 ENCOUNTER — Telehealth: Payer: Self-pay | Admitting: Family Medicine

## 2016-10-02 NOTE — Telephone Encounter (Signed)
Spoke with patient informing patient that her strep was negative. Patient wanted to know if a culture was obtained during her visit.  I explained to the patient that a culture was not ordered. Patient states that she does not feel better I explained to patient that she should call and schedule and appointment with her PCP. Patient states that she was seen at another office yesterday and had a strep and culture done. Nothing further needed.

## 2016-10-02 NOTE — Telephone Encounter (Signed)
Patient called with concerns from last visit stating that she was still not feeling well. Patient would like to know if there was a strep test done or if she needed to make another appointment. Please call patient to advise. Okay to leave a detailed message.

## 2016-10-04 ENCOUNTER — Encounter: Payer: Self-pay | Admitting: Internal Medicine

## 2016-10-04 ENCOUNTER — Ambulatory Visit (INDEPENDENT_AMBULATORY_CARE_PROVIDER_SITE_OTHER): Payer: 59 | Admitting: Internal Medicine

## 2016-10-04 ENCOUNTER — Encounter: Payer: Self-pay | Admitting: *Deleted

## 2016-10-04 VITALS — BP 126/64 | HR 101 | Temp 98.4°F | Wt 134.8 lb

## 2016-10-04 DIAGNOSIS — J329 Chronic sinusitis, unspecified: Secondary | ICD-10-CM

## 2016-10-04 DIAGNOSIS — R0981 Nasal congestion: Secondary | ICD-10-CM

## 2016-10-04 DIAGNOSIS — B9789 Other viral agents as the cause of diseases classified elsewhere: Secondary | ICD-10-CM

## 2016-10-04 MED ORDER — METHYLPREDNISOLONE ACETATE 80 MG/ML IJ SUSP
80.0000 mg | Freq: Once | INTRAMUSCULAR | Status: AC
  Administered 2016-10-04: 80 mg via INTRAMUSCULAR

## 2016-10-04 NOTE — Progress Notes (Signed)
Subjective:    Patient ID: Tamara Mccoy, female    DOB: 1965/07/18, 51 y.o.   MRN: [id number]  HPI  Pt presents to the clinic today with c/o sore throat, body aches. This started 1 week ago. She was seen 5/11 for the same. RST was negative. She was diagnosed with a viral illness and instructed how to treat her symptoms. She was seen again 3 days ago at [place], RST and throat culture were negative. She was advised to start Flonase OTC, which she has not done. She reports nasal congestion, facial pressure, sore throat and cough. She is blowing yellow mucous out of her nose. She is not having any difficulty swallowing anymore. The cough is productive of green mucous. She denies fever, chills or body aches but has been fatigue. She has been taking Ibuprofen OTC with some relief. She does feel like she is starting to turn a corner. She has no history of seasonal allergies. She has not had sick contacts that she is aware of.   Review of Systems   Past Medical History:  Diagnosis Date  . Anxiety   . History of chicken pox   . Hypothyroidism   . Migraine     Current Outpatient Prescriptions  Medication Sig Dispense Refill  . buPROPion (WELLBUTRIN XL) 150 MG 24 hr tablet TAKE 1 TABLET(150 MG) BY MOUTH DAILY 30 tablet 1  . levonorgestrel (MIRENA) 20 MCG/24HR IUD 1 each by Intrauterine route once. Inserted 02/2016    . levothyroxine (SYNTHROID, LEVOTHROID) 88 MCG tablet TAKE 1 TABLET BY MOUTH DAILY AS DIRECTED    . Multiple Vitamin (MULTIVITAMIN) tablet Take 1 tablet by mouth daily.    . rizatriptan (MAXALT-MLT) 10 MG disintegrating tablet Take 10 mg by mouth.    . Vitamin D, Cholecalciferol, 1000 units CAPS Take 1 capsule by mouth daily.     No current facility-administered medications for this visit.     Allergies  Allergen Reactions  . Contrast Media [Iodinated Diagnostic Agents] Hives  . Erythromycin   . Iohexol      Code: HIVES, Desc: Hives on face post injection of 100cc's  Omni., Onset Date: [id number]   . Zithromax [Azithromycin]   . Penicillins Rash    Family History  Problem Relation Age of Onset  . COPD Mother   . Kidney failure Mother        only one working   . Heart Problems Mother        stent   . Uterine cancer Mother   . Heart block Father   . Prostate cancer Father   . Leukemia Maternal Grandmother   . Leukemia Maternal Uncle   . Heart attack Maternal Grandfather   . Heart Problems Paternal Grandmother        bypass  . Prostate cancer Paternal Grandfather     Social History   Social History  . Marital status: Married    Spouse name: Octavia Bruckner  . Number of children: 3  . Years of education: 14   Occupational History  .  [employer]   Social History Main Topics  . Smoking status: Never Smoker  . Smokeless tobacco: Never Used  . Alcohol use Yes     Comment: occasional  . Drug use: No  . Sexual activity: Not Currently   Other Topics Concern  . Not on file   Social History Narrative   Patient lives at home with husband Octavia Bruckner and one son.    Patient has  3 children.    Patient has some college.    Patient works at [employer].      Constitutional: Pt reports fatigue, body aches. Denies fever, malaise, headache or abrupt weight changes.  HEENT: Pt reports nasal congestion, sore throat. Denies eye pain, eye redness, ear pain, ringing in the ears, wax buildup, runny nose, bloody nose. Respiratory: Pt reports cough. Denies difficulty breathing, shortness of breath, or sputum production.     No other specific complaints in a complete review of systems (except as listed in HPI above).  Objective:   Physical Exam   BP 126/64   Pulse (!) 101   Temp 98.4 F (36.9 C) (Oral)   Wt 134 lb 12 oz (61.1 kg)   SpO2 98%   BMI 24.65 kg/m  Wt Readings from Last 3 Encounters:  10/04/16 134 lb 12 oz (61.1 kg)  09/29/16 133 lb 12.8 oz (60.7 kg)  07/18/16 139 lb (63 kg)    General: Appears her stated age, in NAD. HEENT: Head: normal shape  and size, no sinus tenderness noted; Ears: Tm's gray and intact, normal light reflex; Nose: mucosa boggy and moist, septum midline; Throat/Mouth: Teeth present, mucosa pink and moist, + PND, no exudate, lesions or ulcerations noted.  Neck:  Bilateral anterior cervical adenopathy noted.  Cardiovascular: Normal rate and rhythm.  Pulmonary/Chest: Normal effort and positive vesicular breath sounds. No respiratory distress. No wheezes, rales or ronchi noted.       Assessment & Plan:   Viral Sinusitis:  80 mg Depo IM today Encouraged her to start the Flonase as advised Continue Zyrtec Can try a Neti Pot OTC If worse by Friday, will send in Augmentin BID x 10 days  RTC as needed or if symptoms persist or worsen BAITY, REGINA, NP

## 2016-10-04 NOTE — Patient Instructions (Signed)

## 2016-11-07 ENCOUNTER — Encounter: Payer: Self-pay | Admitting: Internal Medicine

## 2016-11-07 ENCOUNTER — Ambulatory Visit (INDEPENDENT_AMBULATORY_CARE_PROVIDER_SITE_OTHER): Payer: 59 | Admitting: Internal Medicine

## 2016-11-07 VITALS — BP 128/84 | HR 89 | Temp 98.4°F | Wt 133.0 lb

## 2016-11-07 DIAGNOSIS — M791 Myalgia, unspecified site: Secondary | ICD-10-CM

## 2016-11-07 DIAGNOSIS — Z9109 Other allergy status, other than to drugs and biological substances: Secondary | ICD-10-CM | POA: Diagnosis not present

## 2016-11-07 DIAGNOSIS — R05 Cough: Secondary | ICD-10-CM | POA: Diagnosis not present

## 2016-11-07 DIAGNOSIS — R059 Cough, unspecified: Secondary | ICD-10-CM

## 2016-11-07 NOTE — Patient Instructions (Signed)

## 2016-11-07 NOTE — Progress Notes (Signed)
Subjective:    Patient ID: Tamara Mccoy, female    DOB: 09-03-1965, 51 y.o.   MRN: [id number]  HPI  Pt presents to the clinic today with c/o a cough. She reports this has been persistent about 1 month, since she was seen for a viral sinusitis. The cough is nonproductive. It seems worse first thing in the morning and at night. She denies runny nose, nasal congestion, ear pain, sore throat. She has taken Flonase with some relief. She has a history of seasonal allergies. She has not had sick contacts.  She also c/o tender points on the backs of her arms, bilateral chest wall (near her breast) and both her inner thighs. She reports she is not in pain all the time, but anytime you touch those areas, it is painful. She does not take anything for this, but is concerned as to what may be the cause.  Review of Systems      Past Medical History:  Diagnosis Date  . Anxiety   . History of chicken pox   . Hypothyroidism   . Migraine     Current Outpatient Prescriptions  Medication Sig Dispense Refill  . buPROPion (WELLBUTRIN XL) 150 MG 24 hr tablet TAKE 1 TABLET(150 MG) BY MOUTH DAILY 30 tablet 1  . levonorgestrel (MIRENA) 20 MCG/24HR IUD 1 each by Intrauterine route once. Inserted 02/2016    . levothyroxine (SYNTHROID, LEVOTHROID) 88 MCG tablet TAKE 1 TABLET BY MOUTH DAILY AS DIRECTED    . Multiple Vitamin (MULTIVITAMIN) tablet Take 1 tablet by mouth daily.    . rizatriptan (MAXALT-MLT) 10 MG disintegrating tablet Take 10 mg by mouth.    . Vitamin D, Cholecalciferol, 1000 units CAPS Take 1 capsule by mouth daily.     No current facility-administered medications for this visit.     Allergies  Allergen Reactions  . Contrast Media [Iodinated Diagnostic Agents] Hives  . Erythromycin   . Iohexol      Code: HIVES, Desc: Hives on face post injection of 100cc's Omni., Onset Date: [id number]   . Zithromax [Azithromycin]   . Penicillins Rash    Family History  Problem Relation Age of  Onset  . COPD Mother   . Kidney failure Mother        only one working   . Heart Problems Mother        stent   . Uterine cancer Mother   . Heart block Father   . Prostate cancer Father   . Leukemia Maternal Grandmother   . Leukemia Maternal Uncle   . Heart attack Maternal Grandfather   . Heart Problems Paternal Grandmother        bypass  . Prostate cancer Paternal Grandfather     Social History   Social History  . Marital status: Married    Spouse name: Octavia Bruckner  . Number of children: 3  . Years of education: 14   Occupational History  .  [employer]   Social History Main Topics  . Smoking status: Never Smoker  . Smokeless tobacco: Never Used  . Alcohol use Yes     Comment: occasional  . Drug use: No  . Sexual activity: Not Currently   Other Topics Concern  . Not on file   Social History Narrative   Patient lives at home with husband Octavia Bruckner and one son.    Patient has 3 children.    Patient has some college.    Patient works at [employer].  Constitutional: Denies fever, malaise, fatigue, headache or abrupt weight changes.  HEENT: Denies eye pain, eye redness, ear pain, ringing in the ears, wax buildup, runny nose, nasal congestion, bloody nose, or sore throat. Respiratory: Pt reports cough. Denies difficulty breathing, shortness of breath, or sputum production.   Cardiovascular: Denies chest pain, chest tightness, palpitations or swelling in the hands or feet.  Musculoskeletal: Pt reports muscle pain. Denies decrease in range of motion, difficulty with gait, or joint pain and swelling.  Skin: Denies redness, rashes, lesions or ulcercations.   No other specific complaints in a complete review of systems (except as listed in HPI above).  Objective:   Physical Exam   BP 128/84   Pulse 89   Temp 98.4 F (36.9 C) (Oral)   Wt 133 lb (60.3 kg)   SpO2 98%   BMI 24.33 kg/m  Wt Readings from Last 3 Encounters:  11/07/16 133 lb (60.3 kg)  10/04/16 134 lb 12 oz  (61.1 kg)  09/29/16 133 lb 12.8 oz (60.7 kg)    General: Appears her stated age, well developed, well nourished in NAD. HEENT: Throat/Mouth: Teeth present, mucosa pink and moist, no exudate, lesions or ulcerations noted.  Neck:  No adenopathy noted. Cardiovascular: Normal rate and rhythm.  Pulmonary/Chest: Normal effort and positive vesicular breath sounds. No respiratory distress. No wheezes, rales or ronchi noted.  Musculoskeletal: Multiple tender points noted.  Neurological: Alert and oriented.  Psychiatric: Mood and affect normal. Behavior is normal. Judgment and thought content normal.      Assessment & Plan:   Muscle Tenderness:  Concerning for Myofascial Pain Syndrome Discussed the workup needed- she declines at this time Encouraged physical exercise and strength training  Cough secondary to Allergies:  Advised her to try Zyrtec in addition to Flonase  Return precautions discussed Webb Silversmith, NP

## 2016-11-22 ENCOUNTER — Other Ambulatory Visit: Payer: Self-pay | Admitting: Internal Medicine

## 2016-12-18 ENCOUNTER — Encounter: Payer: Self-pay | Admitting: Internal Medicine

## 2016-12-18 ENCOUNTER — Ambulatory Visit (INDEPENDENT_AMBULATORY_CARE_PROVIDER_SITE_OTHER): Payer: 59 | Admitting: Internal Medicine

## 2016-12-18 ENCOUNTER — Encounter (INDEPENDENT_AMBULATORY_CARE_PROVIDER_SITE_OTHER): Payer: Self-pay

## 2016-12-18 VITALS — BP 126/86 | HR 112 | Temp 98.9°F | Wt 134.0 lb

## 2016-12-18 DIAGNOSIS — J301 Allergic rhinitis due to pollen: Secondary | ICD-10-CM | POA: Diagnosis not present

## 2016-12-18 MED ORDER — METHYLPREDNISOLONE ACETATE 80 MG/ML IJ SUSP
80.0000 mg | Freq: Once | INTRAMUSCULAR | Status: AC
  Administered 2016-12-18: 80 mg via INTRAMUSCULAR

## 2016-12-18 NOTE — Progress Notes (Signed)
HPI  Pt presents to the clinic today with c/o runny nose, nasal congestion, sore throat and cough. She reports this started 3 days ago. She is blowing clear mucous out or her nose. She denies difficulty swallowing. The cough is productive of yellow mucous. She denies fever or chills but has had body aches. She has tried Zyrtec, Tylenol Sinus, Excedrin and Promethazine DM with minimal relief. She has not had sick contacts that she is aware of.   Review of Systems     Past Medical History:  Diagnosis Date  . Anxiety   . History of chicken pox   . Hypothyroidism   . Migraine     Family History  Problem Relation Age of Onset  . COPD Mother   . Kidney failure Mother        only one working   . Heart Problems Mother        stent   . Uterine cancer Mother   . Heart block Father   . Prostate cancer Father   . Leukemia Maternal Grandmother   . Leukemia Maternal Uncle   . Heart attack Maternal Grandfather   . Heart Problems Paternal Grandmother        bypass  . Prostate cancer Paternal Grandfather     Social History   Social History  . Marital status: Married    Spouse name: Octavia Bruckner  . Number of children: 3  . Years of education: 14   Occupational History  .  [employer]   Social History Main Topics  . Smoking status: Never Smoker  . Smokeless tobacco: Never Used  . Alcohol use Yes     Comment: occasional  . Drug use: No  . Sexual activity: Not Currently   Other Topics Concern  . Not on file   Social History Narrative   Patient lives at home with husband Octavia Bruckner and one son.    Patient has 3 children.    Patient has some college.    Patient works at [employer].     Allergies  Allergen Reactions  . Contrast Media [Iodinated Diagnostic Agents] Hives  . Erythromycin   . Iohexol      Code: HIVES, Desc: Hives on face post injection of 100cc's Omni., Onset Date: [id number]   . Zithromax [Azithromycin]   . Penicillins Rash     Constitutional: Positive fatigue. Denies  headache, fever or abrupt weight changes.  HEENT:  Positive runny nose, asal congestion and sore throat. Denies eye redness, ear pain, ringing in the ears, wax buildup, or bloody nose. Respiratory: Positive cough. Denies difficulty breathing or shortness of breath.  Cardiovascular: Denies chest pain, chest tightness, palpitations or swelling in the hands or feet.   No other specific complaints in a complete review of systems (except as listed in HPI above).  Objective:   BP 126/86   Pulse (!) 112   Temp 98.9 F (37.2 C) (Oral)   Wt 134 lb (60.8 kg)   SpO2 98%   BMI 24.51 kg/m   General: Appears her stated age, in NAD. HEENT: Head: normal shape and size, no sinus tenderness noted; Ears: Tm's gray and intact, normal light reflex; Nose: mucosa boggy and moist, septum midline; Throat/Mouth: + PND. Teeth present, mucosa erythematous and moist, no exudate noted, no lesions or ulcerations noted.  Neck:  No adenopathy noted.  Pulmonary/Chest: Normal effort and positive vesicular breath sounds. No respiratory distress. No wheezes, rales or ronchi noted.       Assessment &  Plan:   Allergic Rhinitis:  80 mg Depo IM today Advised her to start taking Zyrtec and Flonase daily OTC from spring until fall  RTC as needed or if symptoms persist. Webb Silversmith, NP

## 2016-12-18 NOTE — Patient Instructions (Signed)
Allergic Rhinitis Allergic rhinitis is when the mucous membranes in the nose respond to allergens. Allergens are particles in the air that cause your body to have an allergic reaction. This causes you to release allergic antibodies. Through a chain of events, these eventually cause you to release histamine into the blood stream. Although meant to protect the body, it is this release of histamine that causes your discomfort, such as frequent sneezing, congestion, and an itchy, runny nose. What are the causes? Seasonal allergic rhinitis (hay fever) is caused by pollen allergens that may come from grasses, trees, and weeds. Year-round allergic rhinitis (perennial allergic rhinitis) is caused by allergens such as house dust mites, pet dander, and mold spores. What are the signs or symptoms?  Nasal stuffiness (congestion).  Itchy, runny nose with sneezing and tearing of the eyes. How is this diagnosed? Your health care provider can help you determine the allergen or allergens that trigger your symptoms. If you and your health care provider are unable to determine the allergen, skin or blood testing may be used. Your health care provider will diagnose your condition after taking your health history and performing a physical exam. Your health care provider may assess you for other related conditions, such as asthma, pink eye, or an ear infection. How is this treated? Allergic rhinitis does not have a cure, but it can be controlled by:  Medicines that block allergy symptoms. These may include allergy shots, nasal sprays, and oral antihistamines.  Avoiding the allergen. Hay fever may often be treated with antihistamines in pill or nasal spray forms. Antihistamines block the effects of histamine. There are over-the-counter medicines that may help with nasal congestion and swelling around the eyes. Check with your health care provider before taking or giving this medicine. If avoiding the allergen or the  medicine prescribed do not work, there are many new medicines your health care provider can prescribe. Stronger medicine may be used if initial measures are ineffective. Desensitizing injections can be used if medicine and avoidance does not work. Desensitization is when a patient is given ongoing shots until the body becomes less sensitive to the allergen. Make sure you follow up with your health care provider if problems continue. Follow these instructions at home: It is not possible to completely avoid allergens, but you can reduce your symptoms by taking steps to limit your exposure to them. It helps to know exactly what you are allergic to so that you can avoid your specific triggers. Contact a health care provider if:  You have a fever.  You develop a cough that does not stop easily (persistent).  You have shortness of breath.  You start wheezing.  Symptoms interfere with normal daily activities. This information is not intended to replace advice given to you by your health care provider. Make sure you discuss any questions you have with your health care provider. Document Released: 01/31/2001 Document Revised: 01/07/2016 Document Reviewed: 01/13/2013 Elsevier Interactive Patient Education  2017 Elsevier Inc.  

## 2016-12-18 NOTE — Addendum Note (Signed)
Addended by: Lurlean Nanny on: 12/18/2016 12:37 PM   Modules accepted: Orders

## 2017-03-14 ENCOUNTER — Other Ambulatory Visit: Payer: Self-pay | Admitting: Internal Medicine

## 2017-03-14 NOTE — Telephone Encounter (Signed)
CPE reminder letter mailed 

## 2017-04-27 ENCOUNTER — Ambulatory Visit: Payer: 59 | Admitting: Internal Medicine

## 2017-04-28 ENCOUNTER — Ambulatory Visit: Payer: 59 | Admitting: Internal Medicine

## 2017-05-03 ENCOUNTER — Encounter: Payer: Self-pay | Admitting: Internal Medicine

## 2017-05-03 ENCOUNTER — Ambulatory Visit: Payer: 59 | Admitting: Internal Medicine

## 2017-05-03 VITALS — BP 122/84 | HR 78 | Temp 98.6°F | Wt 142.0 lb

## 2017-05-03 DIAGNOSIS — M79642 Pain in left hand: Secondary | ICD-10-CM | POA: Diagnosis not present

## 2017-05-03 DIAGNOSIS — R200 Anesthesia of skin: Secondary | ICD-10-CM

## 2017-05-03 DIAGNOSIS — R29898 Other symptoms and signs involving the musculoskeletal system: Secondary | ICD-10-CM

## 2017-05-03 DIAGNOSIS — M79605 Pain in left leg: Secondary | ICD-10-CM | POA: Diagnosis not present

## 2017-05-03 DIAGNOSIS — M79604 Pain in right leg: Secondary | ICD-10-CM

## 2017-05-03 DIAGNOSIS — M79641 Pain in right hand: Secondary | ICD-10-CM

## 2017-05-03 NOTE — Progress Notes (Signed)
Subjective:    Patient ID: Tamara Mccoy, female    DOB: 1965-05-31, 51 y.o.   MRN: [id number]  HPI  Pt presents to the clinic today with c/o bilateral hand pain, numbness and weakness. This has been going on for months. She describes the pain as achy. She has trouble gripping and holding onto things. She feels like her symptoms are worse at night.  She had a normal xray of her left hand 01/2016. She saw rheumatology for the same, but reports blood work was unremarkable. They did xrays of her hands which were consistent with arthritis. She has tried Aleve and soaked her hands in hot water with minimal relief.   She also reports bilateral leg pain. She reports she only notices this at night. If she lays on her left side, her left leg will hurt. If she lays on her right side, her right leg will hurt. She describes the pain as sore and achy. She denies numbness, tingling or weakness of her legs. She denies low back pain, loss of bowel or bladder. She has not tried anything OTC for her symptoms .  Review of Systems      Past Medical History:  Diagnosis Date  . Anxiety   . History of chicken pox   . Hypothyroidism   . Migraine     Current Outpatient Medications  Medication Sig Dispense Refill  . buPROPion (WELLBUTRIN XL) 150 MG 24 hr tablet Take 1 tablet (150 mg total) by mouth daily. MUST SCHEDULE ANNUAL EXAM FOR REFILLS 30 tablet 1  . levonorgestrel (MIRENA) 20 MCG/24HR IUD 1 each by Intrauterine route once. Inserted 02/2016    . levothyroxine (SYNTHROID, LEVOTHROID) 88 MCG tablet TAKE 1 TABLET BY MOUTH DAILY AS DIRECTED    . Multiple Vitamin (MULTIVITAMIN) tablet Take 1 tablet by mouth daily.    . rizatriptan (MAXALT-MLT) 10 MG disintegrating tablet Take 10 mg by mouth.    . Vitamin D, Cholecalciferol, 1000 units CAPS Take 1 capsule by mouth daily.     No current facility-administered medications for this visit.     Allergies  Allergen Reactions  . Contrast Media [Iodinated  Diagnostic Agents] Hives  . Erythromycin   . Iohexol      Code: HIVES, Desc: Hives on face post injection of 100cc's Omni., Onset Date: [id number]   . Zithromax [Azithromycin]   . Penicillins Rash    Family History  Problem Relation Age of Onset  . COPD Mother   . Kidney failure Mother        only one working   . Heart Problems Mother        stent   . Uterine cancer Mother   . Heart block Father   . Prostate cancer Father   . Leukemia Maternal Grandmother   . Leukemia Maternal Uncle   . Heart attack Maternal Grandfather   . Heart Problems Paternal Grandmother        bypass  . Prostate cancer Paternal Grandfather     Social History   Socioeconomic History  . Marital status: Married    Spouse name: Octavia Bruckner  . Number of children: 3  . Years of education: 19  . Highest education level: Not on file  Social Needs  . Financial resource strain: Not on file  . Food insecurity - worry: Not on file  . Food insecurity - inability: Not on file  . Transportation needs - medical: Not on file  . Transportation needs - non-medical: Not on file  Occupational History    Employer: [employer]  Tobacco Use  . Smoking status: Never Smoker  . Smokeless tobacco: Never Used  Substance and Sexual Activity  . Alcohol use: Yes    Comment: occasional  . Drug use: No  . Sexual activity: Not Currently  Other Topics Concern  . Not on file  Social History Narrative   Patient lives at home with husband Octavia Bruckner and one son.    Patient has 3 children.    Patient has some college.    Patient works at [employer].      Constitutional: Denies fever, malaise, fatigue, headache or abrupt weight changes.  Musculoskeletal: Pt reports bilateral hand pain, weakness and bilateral leg pain. Denies decrease in range of motion, difficulty with gait, or joint swelling.   Neurological: Pt reports bilateral hand numbness. Denies dizziness, difficulty with memory, difficulty with speech or problems with balance and  coordination.   No other specific complaints in a complete review of systems (except as listed in HPI above).  Objective:   Physical Exam   BP 122/84   Pulse 78   Temp 98.6 F (37 C) (Oral)   Wt 142 lb (64.4 kg)   SpO2 98%   BMI 25.97 kg/m  Wt Readings from Last 3 Encounters:  05/03/17 142 lb (64.4 kg)  12/18/16 134 lb (60.8 kg)  11/07/16 133 lb (60.3 kg)    General: Appears her stated age, well developed, well nourished in NAD. Cardiovascular: Radial pulses 2+ bilaterally. Cap refill < 3 secs. Musculoskeletal: Normal flexion, extension and rotation of bilateral wrist. Normal flexion, extension, abduction and adduction of the fingers. No signs of joint swelling or redness. Pain with palpation with bilateral CMC's. Hand grips equal. Normal flexion, extension, internal and external rotation of bilateral hips. Pain with palpation over bilateral trochanter's. Strength 5/5 BLE. Neurological: Alert and oriented. Negative Phalen's. Negative Tinel's. Coordination normal.       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  Bilateral Leg Pain:  ? Trochanteric bursitis Offered RX for Prednisone but she declines at this time Will monitor for now  Return precautions discussed Webb Silversmith, NP

## 2017-05-04 ENCOUNTER — Encounter: Payer: Self-pay | Admitting: Internal Medicine

## 2017-05-04 NOTE — Patient Instructions (Signed)
Paresthesia Paresthesia is a burning or prickling feeling. This feeling can happen in any part of the body. It often happens in the hands, arms, legs, or feet. Usually, it is not painful. In most cases, the feeling goes away in a short time and is not a sign of a serious problem. Follow these instructions at home:  Avoid drinking alcohol.  Try massage or needle therapy (acupuncture) to help with your problems.  Keep all follow-up visits as told by your doctor. This is important. Contact a doctor if:  You keep on having episodes of paresthesia.  Your burning or prickling feeling gets worse when you walk.  You have pain or cramps.  You feel dizzy.  You have a rash. Get help right away if:  You feel weak.  You have trouble walking or moving.  You have problems speaking, understanding, or seeing.  You feel confused.  You cannot control when you pee (urinate) or poop (bowel movement).  You lose feeling (numbness) after an injury.  You pass out (faint). This information is not intended to replace advice given to you by your health care provider. Make sure you discuss any questions you have with your health care provider. Document Released: 04/20/2008 Document Revised: 10/14/2015 Document Reviewed: 05/04/2014 Elsevier Interactive Patient Education  2018 Elsevier Inc.  

## 2017-05-11 ENCOUNTER — Encounter: Payer: 59 | Admitting: Internal Medicine

## 2017-06-05 ENCOUNTER — Encounter: Payer: Self-pay | Admitting: Internal Medicine

## 2017-06-05 ENCOUNTER — Ambulatory Visit (INDEPENDENT_AMBULATORY_CARE_PROVIDER_SITE_OTHER): Payer: 59 | Admitting: Internal Medicine

## 2017-06-05 VITALS — BP 130/82 | HR 110 | Temp 98.1°F | Wt 139.0 lb

## 2017-06-05 DIAGNOSIS — Z634 Disappearance and death of family member: Secondary | ICD-10-CM | POA: Diagnosis not present

## 2017-06-05 DIAGNOSIS — F4321 Adjustment disorder with depressed mood: Secondary | ICD-10-CM

## 2017-06-05 MED ORDER — ALPRAZOLAM 0.25 MG PO TABS: 0.2500 mg | ORAL_TABLET | Freq: Two times a day (BID) | ORAL | 0 refills | Status: DC | PRN

## 2017-06-05 MED ORDER — ONDANSETRON HCL 4 MG PO TABS: 4.0000 mg | ORAL_TABLET | Freq: Three times a day (TID) | ORAL | 0 refills | Status: DC | PRN

## 2017-06-05 NOTE — Progress Notes (Signed)
Subjective:    Patient ID: Tamara Mccoy, female    DOB: 1965/12/07, 52 y.o.   MRN: [id number]  HPI  Pt presents to the clinic today with c/o grief. She reports her husband died unexpectedly on 2022/09/16. The funeral is on September 16, 2022. She is having trouble sleeping, trouble eating. Her nerves are so tore up, she is just nauseated. She denies vomiting. She is taking her Wellbutrin as prescribed. She has supportive family and friends who will be in town for the next 2 weeks. She has already made arrangements for grief counseling through her church and the funeral home.  Review of Systems      Past Medical History:  Diagnosis Date  . Anxiety   . History of chicken pox   . Hypothyroidism   . Migraine     Current Outpatient Medications  Medication Sig Dispense Refill  . buPROPion (WELLBUTRIN XL) 150 MG 24 hr tablet Take 1 tablet (150 mg total) by mouth daily. MUST SCHEDULE ANNUAL EXAM FOR REFILLS 30 tablet 1  . levonorgestrel (MIRENA) 20 MCG/24HR IUD 1 each by Intrauterine route once. Inserted 02/2016    . levothyroxine (SYNTHROID, LEVOTHROID) 88 MCG tablet TAKE 1 TABLET BY MOUTH DAILY AS DIRECTED    . Multiple Vitamin (MULTIVITAMIN) tablet Take 1 tablet by mouth daily.    . rizatriptan (MAXALT-MLT) 10 MG disintegrating tablet Take 10 mg by mouth.    . Vitamin D, Cholecalciferol, 1000 units CAPS Take 1 capsule by mouth daily.     No current facility-administered medications for this visit.     Allergies  Allergen Reactions  . Contrast Media [Iodinated Diagnostic Agents] Hives  . Erythromycin   . Iohexol      Code: HIVES, Desc: Hives on face post injection of 100cc's Omni., Onset Date: [id number]   . Zithromax [Azithromycin]   . Penicillins Rash    Family History  Problem Relation Age of Onset  . COPD Mother   . Kidney failure Mother        only one working   . Heart Problems Mother        stent   . Uterine cancer Mother   . Heart block Father   . Prostate cancer Father    . Leukemia Maternal Grandmother   . Leukemia Maternal Uncle   . Heart attack Maternal Grandfather   . Heart Problems Paternal Grandmother        bypass  . Prostate cancer Paternal Grandfather     Social History   Socioeconomic History  . Marital status: Married    Spouse name: Octavia Bruckner  . Number of children: 3  . Years of education: 55  . Highest education level: Not on file  Social Needs  . Financial resource strain: Not on file  . Food insecurity - worry: Not on file  . Food insecurity - inability: Not on file  . Transportation needs - medical: Not on file  . Transportation needs - non-medical: Not on file  Occupational History    Employer: [employer]  Tobacco Use  . Smoking status: Never Smoker  . Smokeless tobacco: Never Used  Substance and Sexual Activity  . Alcohol use: Yes    Comment: occasional  . Drug use: No  . Sexual activity: Not Currently  Other Topics Concern  . Not on file  Social History Narrative   Patient lives at home with husband Octavia Bruckner and one son.    Patient has 3 children.    Patient has some  college.    Patient works at [employer].      Constitutional: Denies fever, malaise, fatigue, headache or abrupt weight changes.  Gastrointestinal: Pt reports nausea. Denies abdominal pain, bloating, constipation, diarrhea or blood in the stool.  Neurological: Denies dizziness, difficulty with memory, difficulty with speech or problems with balance and coordination.  Psych: Pt reports grief. Denies SI/HI.  No other specific complaints in a complete review of systems (except as listed in HPI above).  Objective:   Physical Exam  BP 130/82   Pulse (!) 110   Temp 98.1 F (36.7 C) (Oral)   Wt 139 lb (63 kg)   SpO2 98%   BMI 25.42 kg/m  Wt Readings from Last 3 Encounters:  06/05/17 139 lb (63 kg)  05/03/17 142 lb (64.4 kg)  12/18/16 134 lb (60.8 kg)    General: Appears her stated age, well developed, well nourished in NAD. Cardiovascular: Tachycardic  with normal rhythm. S1,S2 noted.  No murmur, rubs or gallops noted.  Pulmonary/Chest: Normal effort and positive vesicular breath sounds. No respiratory distress. No wheezes, rales or ronchi noted.  Neurological: Alert and oriented.  Psychiatric: She is tearful today. Judgement and thought content seem normal.        Assessment & Plan:   Grief secondary to Loss of Spouse:  Support offered today Discussed increasing Wellbutrin to 2 tabs daily eRx for Xanax 0.25 mg to take as needed eRx for Zofran for nausea, to help increase appetite Proceed with counseling through church  Return precautions discussed Webb Silversmith, NP

## 2017-06-05 NOTE — Patient Instructions (Signed)
Coping With Loss, Adult People experience loss in many different ways throughout their lives. Events such as moving, changing jobs, and losing friends can create a sense of loss. The loss may be as serious as a major health change, divorce, death of a pet, or death of a loved one. All of these types of loss are likely to create a physical and emotional reaction known as grief. Grief is the result of a major change or an absence of something or someone that you count on. Grief is a normal reaction to loss. How to recognize changes A variety of factors can affect your grieving experience, including:  The nature of your loss.  Your relationship to what or whom you lost.  Your understanding of grief and how to cope with it.  Your support system.  The way that you deal with your grief will affect your ability to function as you normally do. When you are grieving, you may experience:  Numbness, shock, sadness, anxiety, anger, denial, and guilt.  Thoughts about death.  Unexpected crying.  A physical sensation of emptiness in your gut.  Problems sleeping and eating.  Fatigue.  Loss of interest in normal activities.  Dreaming about or imagining seeing the person who died.  A need to remember what or whom you lost.  Difficulty thinking about anything other than your loss for a period of time.  Relief. If you have been expecting the loss for a while, you may feel a sense of relief when it happens.  Where to find support To get support for coping with loss:  Ask your health care provider for help and recommendations, such as grief counseling or therapy.  Think about joining a support group for people who are coping with loss.  Follow these instructions at home:  Be patient with yourself and others. Allow the grieving process to happen, and remember that grieving takes time. ? It is likely that you may never feel completely done with some grief. You may find a way to move on while  still cherishing memories and feelings about your loss. ? Accepting your loss is a process. It can take months or longer to adjust.  Express your feelings in healthy ways, such as: ? Talking with others about your loss. It may be helpful to find others who have had a similar loss, such as a support group. ? Writing down your feelings in a journal. ? Doing physical activities to release stress and emotional energy. ? Doing creative activities like painting, sculpting, or playing or listening to music. ? Practicing resilience. This is the ability to recover and adjust after facing challenges. Reading some resources that encourage resilience may help you to learn ways to practice those behaviors.  Keep to your normal routine as much as possible. If you have trouble focusing or doing normal activities, it is acceptable to take some time away from your normal routine.  Spend time with friends and loved ones.  Eat a healthy diet, get plenty of sleep, and rest when you feel tired. Where to find more information: You can find more information about coping with loss from:  American Society of Clinical Oncology: www.cancer.net  American Psychological Association: www.apa.org  Contact a health care provider if:  Your grief is extreme and keeps getting worse.  You have ongoing grief that does not improve.  Your body shows symptoms of grief, such as illness.  You feel depressed, anxious, or lonely. Get help right away if:  You have   thoughts about hurting yourself or others. If you ever feel like you may hurt yourself or others, or have thoughts about taking your own life, get help right away. You can go to your nearest emergency department or call:  Your local emergency services (911 in the [place]  A suicide crisis helpline, such as the National Suicide Prevention Lifeline at 1-800-273-8255. This is open 24 hours a day.  Summary  Grief is a normal part of experiencing a loss. It is the  result of a major change or an absence of something or someone that you count on.  The depth of grief and the period of recovery depend on the type of loss as well as your ability to adjust to the change and process your feelings.  Processing grief requires patience and a willingness to accept your feelings and talk about your loss with people who are supportive.  It is important to find resources that work for you and to realize that we are all different when it comes to grief. There is not one single grieving process that works for everyone in the same way.  Be aware that when grief becomes extreme, it can lead to more severe issues like isolation, depression, anxiety, or suicidal thoughts. Talk with your health care provider if you have any of these issues. This information is not intended to replace advice given to you by your health care provider. Make sure you discuss any questions you have with your health care provider. Document Released: 09/21/2016 Document Revised: 09/21/2016 Document Reviewed: 09/21/2016 Elsevier Interactive Patient Education  2018 Elsevier Inc.  

## 2017-06-09 ENCOUNTER — Other Ambulatory Visit: Payer: Self-pay | Admitting: Internal Medicine

## 2017-06-28 ENCOUNTER — Telehealth: Payer: Self-pay | Admitting: Internal Medicine

## 2017-06-28 NOTE — Telephone Encounter (Signed)
Done, placed in my outbox for Roylene

## 2017-06-28 NOTE — Telephone Encounter (Signed)
fmla paperwork in Tamara Mccoy's in box Spoke to pt fmla for death of spouse she stated she wanted intermittant In Tamara Mccoy's in box

## 2017-06-29 NOTE — Telephone Encounter (Signed)
Paperwork faxed °Pt aware °Copy for scan °Copy for pt °

## 2017-07-05 ENCOUNTER — Ambulatory Visit: Payer: Self-pay | Admitting: Neurology

## 2017-07-09 ENCOUNTER — Telehealth: Payer: Self-pay | Admitting: Internal Medicine

## 2017-07-09 NOTE — Telephone Encounter (Signed)
Pt dropped fmla that needed to be completed. Pt highlight areas that needed to be completed In Regina's in box

## 2017-07-09 NOTE — Telephone Encounter (Signed)
Done, in my outbox

## 2017-07-10 NOTE — Telephone Encounter (Signed)
Forms faxed

## 2017-07-10 NOTE — Telephone Encounter (Signed)
Pt aware Copy for scan Copy for file

## 2017-07-30 ENCOUNTER — Telehealth: Payer: Self-pay

## 2017-07-30 NOTE — Telephone Encounter (Signed)
PLEASE NOTE: All timestamps contained within this report are represented as [place] Standard Time. CONFIDENTIALTY NOTICE: This fax transmission is intended only for the addressee. It contains information that is legally privileged, confidential or otherwise protected from use or disclosure. If you are not the intended recipient, you are strictly prohibited from reviewing, disclosing, copying using or disseminating any of this information or taking any action in reliance on or regarding this information. If you have received this fax in error, please notify us immediately by telephone so that we can arrange for its return to [place]. Phone: [phone], Toll-Free: [phone], Fax: [phone] Page: 1 of 2 Call Id: [id number] [place] Patient Name: Tamara Mccoy Gender: Female DOB: 08-20-1965 Age: 52 Y 30 M 7 D Return Phone Number: [phone] (Primary) Address: City/State/Zip: [place] [address] Client [place] Night - Client Client Site [facility] Physician Webb Silversmith - NP Contact Type Call Who Is Calling Patient / Member / Family / Caregiver Call Type Triage / Clinical Relationship To Patient Self Return Phone Number [phone] (Primary) Chief Complaint Headache Reason for Call Symptomatic / Request for [place] states she thinks she has the flu. Caller states she has a headache, cough, diarrhea, vomiting, and weakness. Translation No Nurse Assessment Nurse: Harlow Mares, RN, Suanne Marker Date/Time (Eastern Time): 07/28/2017 2:14:46 PM Confirm and document reason for call. If symptomatic, describe symptoms. ---Caller states she thinks she has the flu. Caller states she has a headache, cough, diarrhea, vomiting, and weakness, denies fever but states that she is taking tylenol and advil. Reports that  her symptoms began cough 2 days ago. Body aches today. Does the patient have any new or worsening symptoms? ---Yes Will a triage be completed? ---Yes Related visit to physician within the last 2 weeks? ---No Does the PT have any chronic conditions? (i.e. diabetes, asthma, etc.) ---No Is the patient pregnant or possibly pregnant? (Ask all females between the ages of 44-55) ---No Is this a behavioral health or substance abuse call? ---No Guidelines Guideline Title Affirmed Question Affirmed Notes Nurse Date/Time Eilene Ghazi Time) Common Cold Cold with no complications Harlow Mares, RN, Suanne Marker 07/28/2017 2:17:05 PM Disp. Time Eilene Ghazi Time) Disposition Final User 07/28/2017 2:21:25 PM Home Care Yes Harlow Mares, RN, Rosalyn Charters Disagree/Comply Comply PLEASE NOTE: All timestamps contained within this report are represented as [place] Standard Time. CONFIDENTIALTY NOTICE: This fax transmission is intended only for the addressee. It contains information that is legally privileged, confidential or otherwise protected from use or disclosure. If you are not the intended recipient, you are strictly prohibited from reviewing, disclosing, copying using or disseminating any of this information or taking any action in reliance on or regarding this information. If you have received this fax in error, please notify us immediately by telephone so that we can arrange for its return to [place]. Phone: [phone], Toll-Free: [phone], Fax: [phone] Page: 2 of 2 Call Id: [id number] [place] Understands Yes PreDisposition Did not know what to do Care Advice Given Per Guideline HOME CARE: You should be able to treat this at home. REASSURANCE AND EDUCATION: * It sounds like an uncomplicated cold that we can treat at home. * Colds are very common and may make you feel uncomfortable. * Colds are caused by viruses, and no medicine or 'shot' will cure an uncomplicated cold. * Colds are usually not serious. FOR A STUFFY NOSE -  USE NASAL  WASHES: * Introduction: Saline (salt water) nasal irrigation (nasal wash) is an effective and simple home remedy for treating stuffy nose and sinus congestion. The nose can be irrigated by pouring, spraying, or squirting salt water into the nose and then letting it run back out. * How it Helps: The salt water rinses out excess mucus, washes out any irritants (dust, allergens) that might be present, and moistens the nasal cavity. * Methods: There are several ways to perform nasal irrigation. You can use a saline nasal spray bottle (available over-the-counter), a rubber ear syringe, a medical syringe without the needle, or a NETI POT. FOR A RUNNY NOSE - BLOW YOUR NOSE: * Nasal mucus and discharge help wash viruses and bacteria out of the nose and sinuses. * Blowing your nose helps clean out your nose. Use a handkerchief or a paper tissue. * If the skin around your nostrils gets irritated, apply a tiny amount of petroleum ointment to the nasal openings once or twice a day. MEDICINES FOR STUFFY OR RUNNY NOSE: * Most cold medicines that are available over-the-counter (OTC) are not helpful. * Antihistamines: Are only helpful if you also have nasal allergies. * If you have a very runny nose and you really think you need a medicine, you can try using a nasal decongestant for a couple days. NASAL DECONGESTANTS FOR A VERY STUFFY NOSE: * MOST PEOPLE DO NOT NEED TO USE THESE MEDICINES. * If your nose feels blocked, you should try using nasal washes first. HUMIDIFIER: If the air in your home is dry, use a humidifier. * You become worse. * You become short of breath * Runny nose lasts over 10 days * Fever lasts over 3 days CALL BACK IF: CARE ADVICE given per Colds (Adult) guideline. * Hydrate: Drink adequate liquids. * For muscle aches, headaches, or moderate fever (more than 101 F or 38.3 C): Take acetaminophen every 4 hours. TREATMENT FOR OTHER COLD SYMPTOMS:

## 2017-07-30 NOTE — Telephone Encounter (Signed)
Unable to reach pt or pts husband by phone for update on condition.

## 2017-07-30 NOTE — Telephone Encounter (Signed)
Pt called back; diarrhea has stopped this morning; pt feels weak and still some abd achy pain. Pt feels better and does not want to schedule appt; pt will cb if needed.

## 2017-07-30 NOTE — Telephone Encounter (Signed)
noted 

## 2017-08-02 ENCOUNTER — Telehealth: Payer: Self-pay | Admitting: Internal Medicine

## 2017-08-02 NOTE — Telephone Encounter (Signed)
She can take zyrtec and flonase OTC, otherwise, she would need to be seen

## 2017-08-02 NOTE — Telephone Encounter (Signed)
Copied from [place] [id number]. Topic: Quick Communication - See Telephone Encounter >> Aug 02, 2017 10:29 AM Burnis Medin, NT wrote: CRM for notification. See Telephone encounter for: Patent called and said she has been coughing up green mucous and having some drainage from her nose. Pt wanted to see if the doctor could call her something in to [facility], [address] [phone] (Phone) [phone] (Fax)    08/02/17

## 2017-08-02 NOTE — Telephone Encounter (Signed)
Pt. States she started last Thursday with sinus congestion, cough and sore throat. Sore throat has resolved, but is coughing up green phlegm and has green nasal drainage as well.  Offered OV.States she cannot come in for an office visit due to "too many people out sick." Request Ms. Baity call "something " in for her. Please advise pt.

## 2017-08-03 NOTE — Telephone Encounter (Signed)
Pt is aware as instructed and expressed understanding 

## 2017-08-08 ENCOUNTER — Ambulatory Visit (INDEPENDENT_AMBULATORY_CARE_PROVIDER_SITE_OTHER): Payer: 59 | Admitting: Internal Medicine

## 2017-08-08 ENCOUNTER — Encounter: Payer: Self-pay | Admitting: Internal Medicine

## 2017-08-08 VITALS — BP 122/82 | HR 92 | Temp 97.9°F | Ht 61.75 in | Wt 129.0 lb

## 2017-08-08 DIAGNOSIS — Z1211 Encounter for screening for malignant neoplasm of colon: Secondary | ICD-10-CM

## 2017-08-08 DIAGNOSIS — F419 Anxiety disorder, unspecified: Secondary | ICD-10-CM

## 2017-08-08 DIAGNOSIS — Z0000 Encounter for general adult medical examination without abnormal findings: Secondary | ICD-10-CM

## 2017-08-08 DIAGNOSIS — R899 Unspecified abnormal finding in specimens from other organs, systems and tissues: Secondary | ICD-10-CM

## 2017-08-08 DIAGNOSIS — E039 Hypothyroidism, unspecified: Secondary | ICD-10-CM | POA: Diagnosis not present

## 2017-08-08 DIAGNOSIS — K580 Irritable bowel syndrome with diarrhea: Secondary | ICD-10-CM | POA: Diagnosis not present

## 2017-08-08 NOTE — Patient Instructions (Signed)

## 2017-08-08 NOTE — Assessment & Plan Note (Signed)
Chronic but stable on Wellbutrin Xanax d/c'd  Will monitor

## 2017-08-08 NOTE — Progress Notes (Signed)
Subjective:    Patient ID: Tamara Mccoy, female    DOB: 02-17-1966, 52 y.o.   MRN: [id number]  HPI  Pt presents to the clinic today for her annual exam. She is also due to follow up chronic conditions.  IBS: Mainly loose stools. She is taking Zofran as needed. She tries to control this with diet.  Anxiety: Worse after the death of her husband. She is taking Wellbutrin as prescribed. She was given Xanax for a short time during the grieving process and reports she is no longer taking this.  Hypothyroidism: She denies any issues on her current dose of Synthroid. She would like a referral to endocrinology.  Flu: 04/2016 Tetanus: 02/2009 Pap Smear: 12/2016 Mammogram: 12/2016 Colon Screening: 2009 Vision Screening: annually  Dentist: biannually  Diet: She does eat meat. She consumes fruits and veggies daily. She occasionally eats fried foods. She drinks mostly water. Exercise: None  Review of Systems      Past Medical History:  Diagnosis Date  . Anxiety   . History of chicken pox   . Hypothyroidism   . Migraine     Current Outpatient Medications  Medication Sig Dispense Refill  . ALPRAZolam (XANAX) 0.25 MG tablet Take 1 tablet (0.25 mg total) by mouth 2 (two) times daily as needed for anxiety. 20 tablet 0  . buPROPion (WELLBUTRIN XL) 150 MG 24 hr tablet Take 1 tablet (150 mg total) by mouth daily. 30 tablet 1  . levonorgestrel (MIRENA) 20 MCG/24HR IUD 1 each by Intrauterine route once. Inserted 02/2016    . levothyroxine (SYNTHROID, LEVOTHROID) 88 MCG tablet TAKE 1 TABLET BY MOUTH DAILY AS DIRECTED    . Multiple Vitamin (MULTIVITAMIN) tablet Take 1 tablet by mouth daily.    . ondansetron (ZOFRAN) 4 MG tablet Take 1 tablet (4 mg total) by mouth every 8 (eight) hours as needed. 20 tablet 0  . rizatriptan (MAXALT-MLT) 10 MG disintegrating tablet Take 10 mg by mouth.    . Vitamin D, Cholecalciferol, 1000 units CAPS Take 1 capsule by mouth daily.     No current  facility-administered medications for this visit.     Allergies  Allergen Reactions  . Contrast Media [Iodinated Diagnostic Agents] Hives  . Erythromycin   . Iohexol      Code: HIVES, Desc: Hives on face post injection of 100cc's Omni., Onset Date: 35009381   . Zithromax [Azithromycin]   . Penicillins Rash    Family History  Problem Relation Age of Onset  . COPD Mother   . Kidney failure Mother        only one working   . Heart Problems Mother        stent   . Uterine cancer Mother   . Heart block Father   . Prostate cancer Father   . Leukemia Maternal Grandmother   . Leukemia Maternal Uncle   . Heart attack Maternal Grandfather   . Heart Problems Paternal Grandmother        bypass  . Prostate cancer Paternal Grandfather     Social History   Socioeconomic History  . Marital status: Married    Spouse name: Octavia Bruckner  . Number of children: 3  . Years of education: 12  . Highest education level: Not on file  Social Needs  . Financial resource strain: Not on file  . Food insecurity - worry: Not on file  . Food insecurity - inability: Not on file  . Transportation needs - medical: Not on file  .  Transportation needs - non-medical: Not on file  Occupational History    Employer: [place] [employer]  Tobacco Use  . Smoking status: Never Smoker  . Smokeless tobacco: Never Used  Substance and Sexual Activity  . Alcohol use: Yes    Comment: occasional  . Drug use: No  . Sexual activity: Not Currently  Other Topics Concern  . Not on file  Social History Narrative   Patient lives at home with husband Octavia Bruckner and one son.    Patient has 3 children.    Patient has some college.    Patient works at [employer].      Constitutional: Denies fever, malaise, fatigue, headache or abrupt weight changes.  HEENT: Denies eye pain, eye redness, ear pain, ringing in the ears, wax buildup, runny nose, nasal congestion, bloody nose, or sore throat. Respiratory: Denies difficulty breathing, shortness  of breath, cough or sputum production.   Cardiovascular: Denies chest pain, chest tightness, palpitations or swelling in the hands or feet.  Gastrointestinal: Denies abdominal pain, bloating, constipation, diarrhea or blood in the stool.  GU: Denies urgency, frequency, pain with urination, burning sensation, blood in urine, odor or discharge. Musculoskeletal: Denies decrease in range of motion, difficulty with gait, muscle pain or joint pain and swelling.  Skin: Denies redness, rashes, lesions or ulcercations.  Neurological: Denies dizziness, difficulty with memory, difficulty with speech or problems with balance and coordination.  Psych: Denies anxiety, depression, SI/HI.  No other specific complaints in a complete review of systems (except as listed in HPI above).  Objective:   Physical Exam  BP 122/82   Pulse 92   Temp 97.9 F (36.6 C) (Oral)   Ht 5' 1.75" (1.568 m)   Wt 129 lb (58.5 kg)   SpO2 98%   BMI 23.79 kg/m  Wt Readings from Last 3 Encounters:  08/08/17 129 lb (58.5 kg)  06/05/17 139 lb (63 kg)  05/03/17 142 lb (64.4 kg)    General: Appears her stated age, well developed, well nourished in NAD. Skin: Warm, dry and intact.  HEENT: Head: normal shape and size; Eyes: sclera white, no icterus, conjunctiva pink, PERRLA and EOMs intact; Throat/Mouth: Teeth present, mucosa pink and moist, no exudate, lesions or ulcerations noted.  Neck:  Neck supple, trachea midline. No masses, lumps present.  Cardiovascular: Normal rate and rhythm. S1,S2 noted.  No murmur, rubs or gallops noted. No JVD or BLE edema.  Pulmonary/Chest: Normal effort and positive vesicular breath sounds. No respiratory distress. No wheezes, rales or ronchi noted.  Abdomen: Soft and nontender. Normal bowel sounds. No distention or masses noted. Liver, spleen and kidneys non palpable. Musculoskeletal: Strength 5/5 BUE/BLE. No difficulty with gait.  Neurological: Alert and oriented. Cranial nerves II-XII grossly  intact. Coordination normal.  Psychiatric: Mood and affect normal. Behavior is normal. Judgment and thought content normal.       Assessment & Plan:   Preventative Health Maintnance:  She declines flu shot today Tetanus UTD Pap smear and mammogram UTD Referral placed to GI for screening colonoscopy Encouraged her to see an eye doctor and dentist annually Advised her to consume a balanced diet and exercise regimen Will check CBC, CMET, Lipid, TSH, Free T4 and Vit D today  RTC in 1 year, sooner if needed .mecred

## 2017-08-08 NOTE — Assessment & Plan Note (Signed)
TSH and Free T4 today Continue Synthroid for now, will adjust if needed Referral placed to endo per patient request

## 2017-08-08 NOTE — Assessment & Plan Note (Signed)
Controlled with diet and Zofran Continue FODMAP diet

## 2017-08-09 ENCOUNTER — Encounter: Payer: Self-pay | Admitting: Internal Medicine

## 2017-08-09 LAB — COMPREHENSIVE METABOLIC PANEL
ALK PHOS: 109 U/L (ref 39–117)
ALT: 56 U/L — AB (ref 0–35)
AST: 43 U/L — AB (ref 0–37)
Albumin: 4.6 g/dL (ref 3.5–5.2)
BILIRUBIN TOTAL: 0.2 mg/dL (ref 0.2–1.2)
BUN: 15 mg/dL (ref 6–23)
CO2: 26 mEq/L (ref 19–32)
CREATININE: 0.82 mg/dL (ref 0.40–1.20)
Calcium: 9.7 mg/dL (ref 8.4–10.5)
Chloride: 103 mEq/L (ref 96–112)
GFR: 77.32 mL/min (ref >60.00)
GLUCOSE: 78 mg/dL (ref 70–99)
Potassium: 4.4 mEq/L (ref 3.5–5.1)
SODIUM: 138 meq/L (ref 135–145)
TOTAL PROTEIN: 7.1 g/dL (ref 6.0–8.3)

## 2017-08-09 LAB — TSH: TSH: 6.92 u[IU]/mL — ABNORMAL HIGH (ref 0.35–4.50)

## 2017-08-09 LAB — CBC
HCT: 41.1 % (ref 36.0–46.0)
HEMOGLOBIN: 13.5 g/dL (ref 12.0–15.0)
MCHC: 32.8 g/dL (ref 30.0–36.0)
MCV: 92.9 fl (ref 78.0–100.0)
Platelets: 336 10*3/uL (ref 150.0–400.0)
RBC: 4.42 Mil/uL (ref 3.87–5.11)
RDW: 13.8 % (ref 11.5–15.5)
WBC: 11 10*3/uL — AB (ref 4.0–10.5)

## 2017-08-09 LAB — LIPID PANEL
Cholesterol: 199 mg/dL (ref 0–200)
HDL: 79.4 mg/dL (ref >39.00)
LDL CALC: 105 mg/dL — AB (ref 0–99)
NonHDL: 119.87
Total CHOL/HDL Ratio: 3
Triglycerides: 76 mg/dL (ref 0.0–149.0)
VLDL: 15.2 mg/dL (ref 0.0–40.0)

## 2017-08-09 LAB — VITAMIN D 25 HYDROXY (VIT D DEFICIENCY, FRACTURES): VITD: 22.5 ng/mL — AB (ref 30.00–100.00)

## 2017-08-09 LAB — T4, FREE: Free T4: 0.78 ng/dL (ref 0.60–1.60)

## 2017-08-10 ENCOUNTER — Other Ambulatory Visit: Payer: Self-pay | Admitting: Internal Medicine

## 2017-08-10 DIAGNOSIS — E039 Hypothyroidism, unspecified: Secondary | ICD-10-CM

## 2017-08-10 DIAGNOSIS — E559 Vitamin D deficiency, unspecified: Secondary | ICD-10-CM

## 2017-08-10 DIAGNOSIS — R748 Abnormal levels of other serum enzymes: Secondary | ICD-10-CM

## 2017-08-10 MED ORDER — VITAMIN D (ERGOCALCIFEROL) 1.25 MG (50000 UNIT) PO CAPS: 50000.0000 [IU] | ORAL_CAPSULE | ORAL | 0 refills | Status: DC

## 2017-08-10 MED ORDER — LEVOTHYROXINE SODIUM 100 MCG PO TABS: 100.0000 ug | ORAL_TABLET | Freq: Every day | ORAL | 1 refills | Status: DC

## 2017-08-10 NOTE — Addendum Note (Signed)
Addended by: Lurlean Nanny on: 08/10/2017 05:39 PM   Modules accepted: Orders

## 2017-08-13 ENCOUNTER — Encounter: Payer: Self-pay | Admitting: Gastroenterology

## 2017-08-20 ENCOUNTER — Other Ambulatory Visit: Payer: Self-pay | Admitting: Internal Medicine

## 2017-09-12 ENCOUNTER — Other Ambulatory Visit (INDEPENDENT_AMBULATORY_CARE_PROVIDER_SITE_OTHER): Payer: 59

## 2017-09-12 ENCOUNTER — Encounter: Payer: Self-pay | Admitting: Internal Medicine

## 2017-09-12 DIAGNOSIS — E559 Vitamin D deficiency, unspecified: Secondary | ICD-10-CM

## 2017-09-12 DIAGNOSIS — E039 Hypothyroidism, unspecified: Secondary | ICD-10-CM

## 2017-09-12 DIAGNOSIS — R899 Unspecified abnormal finding in specimens from other organs, systems and tissues: Secondary | ICD-10-CM

## 2017-09-12 LAB — COMPREHENSIVE METABOLIC PANEL
ALBUMIN: 4.1 g/dL (ref 3.5–5.2)
ALK PHOS: 107 U/L (ref 39–117)
ALT: 17 U/L (ref 0–35)
AST: 20 U/L (ref 0–37)
BILIRUBIN TOTAL: 0.3 mg/dL (ref 0.2–1.2)
BUN: 17 mg/dL (ref 6–23)
CALCIUM: 9.2 mg/dL (ref 8.4–10.5)
CO2: 28 mEq/L (ref 19–32)
CREATININE: 0.81 mg/dL (ref 0.40–1.20)
Chloride: 106 mEq/L (ref 96–112)
GFR: 78.4 mL/min (ref >60.00)
Glucose, Bld: 103 mg/dL — ABNORMAL HIGH (ref 70–99)
Potassium: 4.6 mEq/L (ref 3.5–5.1)
Sodium: 139 mEq/L (ref 135–145)
TOTAL PROTEIN: 7 g/dL (ref 6.0–8.3)

## 2017-09-12 LAB — TSH: TSH: 0.06 u[IU]/mL — ABNORMAL LOW (ref 0.35–4.50)

## 2017-09-12 LAB — T4, FREE: FREE T4: 0.94 ng/dL (ref 0.60–1.60)

## 2017-09-12 LAB — VITAMIN D 25 HYDROXY (VIT D DEFICIENCY, FRACTURES): VITD: 66.25 ng/mL (ref 30.00–100.00)

## 2017-09-24 DIAGNOSIS — L299 Pruritus, unspecified: Secondary | ICD-10-CM | POA: Insufficient documentation

## 2017-10-08 ENCOUNTER — Ambulatory Visit (AMBULATORY_SURGERY_CENTER): Payer: Self-pay | Admitting: *Deleted

## 2017-10-08 ENCOUNTER — Other Ambulatory Visit: Payer: Self-pay

## 2017-10-08 VITALS — Ht 62.0 in | Wt 128.8 lb

## 2017-10-08 DIAGNOSIS — Z1211 Encounter for screening for malignant neoplasm of colon: Secondary | ICD-10-CM

## 2017-10-08 MED ORDER — NA SULFATE-K SULFATE-MG SULF 17.5-3.13-1.6 GM/177ML PO SOLN: 1.0000 [IU] | Freq: Once | ORAL | 0 refills | Status: AC

## 2017-10-08 NOTE — Progress Notes (Signed)
No egg or soy allergy known to patient  No issues with past sedation with any surgeries  or procedures, no intubation problems  No diet pills per patient No home 02 use per patient  No blood thinners per patient  Pt denies issues with constipation occasionally change diet  No A fib or A flutter  EMMI video sent to pt's e mail pt. declined

## 2017-10-10 ENCOUNTER — Ambulatory Visit: Payer: 59 | Admitting: Internal Medicine

## 2017-10-10 ENCOUNTER — Encounter: Payer: Self-pay | Admitting: Internal Medicine

## 2017-10-10 VITALS — BP 122/78 | HR 85 | Ht 62.0 in | Wt 128.8 lb

## 2017-10-10 DIAGNOSIS — E039 Hypothyroidism, unspecified: Secondary | ICD-10-CM | POA: Diagnosis not present

## 2017-10-10 DIAGNOSIS — E042 Nontoxic multinodular goiter: Secondary | ICD-10-CM | POA: Diagnosis not present

## 2017-10-10 NOTE — Patient Instructions (Signed)
Please move the Multivitamins >4h after Levothyroxine.  Please continue Levothyroxine 88 mcg daily.  Take the thyroid hormone every day, with water, at least 30 minutes before breakfast, separated by at least 4 hours from: - acid reflux medications - calcium - iron - multivitamins  Please come back for a follow-up appointment in 6 months.  Please schedule a lab appt in 5-6 weeks.

## 2017-10-10 NOTE — Progress Notes (Addendum)
Will patient ID: Tamara Mccoy, female   DOB: 1965/07/06, 52 y.o.   MRN: [id number]    HPI  Tamara Mccoy is a 52 y.o.-year-old female, referred by her PCP, Jearld Fenton, NP, for management of hypothyroidism and thyroid nodules.  He saw Dr. Posey Pronto before, at [facility] endocrinology.  Pt. has been dx with hypothyroidism in ~2014 >> on Levothyroxine 88 mcg (on this dose for a long time - then increased to 100 mcg daily in 07/2017, then decreased from 100 mcg daily in 08/2017).   She takes the thyroid hormone: - fasting - with water - no coffee - separated by >30 min from b'fast  - no calcium, iron, PPIs - + multivitamins with b'fast! - no recent PPIs  I reviewed pt's thyroid tests: Lab Results  Component Value Date   TSH 0.06 (L) 09/12/2017   TSH 6.92 (H) 08/08/2017   FREET4 0.94 09/12/2017   FREET4 0.78 08/08/2017   Antithyroid antibodies: No results found for: THGAB No components found for: TPOAB  Thyroid nodules:  Reviewed previous ultrasound reports: 07/2010: Right thyroid lobe:  3.9 x 1.1 x 1.4 cm Left thyroid lobe:  3.6 x 1.1 x 1.1 cm Isthmus:  5 mm Focal nodules:   Thyroid echotexture is heterogeneous.  No well- defined nodule or dominant mass identified.   Possible hypoechoic lesion measures 8 mm in the inter/lower pole right lobe on image 16. Suspect a hypoechoic nodule within the medial interpolar right lobe.  1.0 x 0.9 x 0.8 cm on image 9 image 26. A 5 mm hyperechoic nodule in the lower pole left lobe on image 41. A 7 mm hyperechoic upper pole left sided nodule. No abnormal calcifications. Lymphadenopathy:  None visualized.   10/2016 -thyroid ultrasound performed in Dr. Serita Grit office:   The thyroid nodule is fading away and is currently measuring 0.5 x 0.3 cm. Repeat ultrasound in 2-3 years.   Pt denies: - weight gain - fatigue - cold intolerance, + has hot flashes - constipation - only mild, now resolved after change in diet - dry skin -  hair loss  Pt denies feeling nodules in neck, hoarseness, dysphagia/odynophagia, SOB with lying down.  She has no FH of thyroid disorders. No FH of thyroid cancer.  No h/o radiation tx to head or neck. No recent use of iodine supplements. No Biotin. No steroids.  Pt. also has a history of anxiety, depression (grief at losing husband in 05/2017).   ROS: Constitutional: no weight gain/loss, no fatigue, + hot flushes (menop) Eyes: no blurry vision, no xerophthalmia ENT: no sore throat, no nodules palpated in throat, no dysphagia/odynophagia, + hoarseness Cardiovascular: no CP/SOB/palpitations/+ leg swelling Respiratory: no cough/SOB Gastrointestinal: no N/V/D/+ C - occasional Musculoskeletal: no muscle/joint aches Skin: no rashes Neurological: no tremors/numbness/tingling/dizziness, + HAs chronic Psychiatric: + depression/anxiety  Past Medical History:  Diagnosis Date  . Allergy   . Anxiety   . Depression   . History of chicken pox   . Hypothyroidism   . Migraine    Past Surgical History:  Procedure Laterality Date  . COLONOSCOPY    . WISDOM TOOTH EXTRACTION     Social History   Socioeconomic History  . Marital status: Married    Spouse name: Octavia Bruckner  . Number of children: 3  . Years of education: 83  . Highest education level: Not on file  Occupational History    Employer: [employer]  Social Needs  . Financial resource strain: Not on file  .  Food insecurity:    Worry: Not on file    Inability: Not on file  . Transportation needs:    Medical: Not on file    Non-medical: Not on file  Tobacco Use  . Smoking status: Never Smoker  . Smokeless tobacco: Never Used  Substance and Sexual Activity  . Alcohol use: Yes    Comment: occasional  . Drug use: No  . Sexual activity: Not Currently  Lifestyle  . Physical activity:    Days per week: Not on file    Minutes per session: Not on file  . Stress: Not on file  Relationships  . Social connections:    Talks on  phone: Not on file    Gets together: Not on file    Attends religious service: Not on file    Active member of club or organization: Not on file    Attends meetings of clubs or organizations: Not on file    Relationship status: Not on file  . Intimate partner violence:    Fear of current or ex partner: Not on file    Emotionally abused: Not on file    Physically abused: Not on file    Forced sexual activity: Not on file  Other Topics Concern  . Not on file  Social History Narrative   Patient lives at home with husband Octavia Bruckner and one son.    Patient has 3 children.    Patient has some college.    Patient works at [employer].    Current Outpatient Medications on File Prior to Visit  Medication Sig Dispense Refill  . buPROPion (WELLBUTRIN XL) 150 MG 24 hr tablet TAKE 1 TABLET(150 MG) BY MOUTH DAILY 30 tablet 11  . fluticasone (FLONASE) 50 MCG/ACT nasal spray Place into both nostrils daily.    Marland Kitchen levonorgestrel (MIRENA) 20 MCG/24HR IUD 1 each by Intrauterine route once. Inserted 02/2016    . Levothyroxine Sodium 88 MCG CAPS Take by mouth daily before breakfast.    . Multiple Vitamin (MULTIVITAMIN) tablet Take 1 tablet by mouth daily.    . rizatriptan (MAXALT-MLT) 10 MG disintegrating tablet Take 10 mg by mouth.     No current facility-administered medications on file prior to visit.    Allergies  Allergen Reactions  . Contrast Media [Iodinated Diagnostic Agents] Hives  . Erythromycin   . Iohexol      Code: HIVES, Desc: Hives on face post injection of 100cc's Omni., Onset Date: 95638756   . Zithromax [Azithromycin]   . Penicillins Rash   Family History  Problem Relation Age of Onset  . COPD Mother   . Kidney failure Mother        only one working   . Heart Problems Mother        stent   . Uterine cancer Mother   . Heart block Father   . Prostate cancer Father   . Leukemia Maternal Grandmother   . Leukemia Maternal Uncle   . Heart attack Maternal Grandfather   . Heart Problems  Paternal Grandmother        bypass  . Prostate cancer Paternal Grandfather   . Colon cancer Neg Hx   . Colon polyps Neg Hx   . Esophageal cancer Neg Hx   . Stomach cancer Neg Hx   . Rectal cancer Neg Hx     PE: BP 122/78   Pulse 85   Ht 5\' 2"  (1.575 m)   Wt 128 lb 12.8 oz (58.4 kg)   SpO2  99%   BMI 23.56 kg/m  Wt Readings from Last 3 Encounters:  10/10/17 128 lb 12.8 oz (58.4 kg)  10/08/17 128 lb 12.8 oz (58.4 kg)  08/08/17 129 lb (58.5 kg)   Constitutional: normal weight, in NAD Eyes: PERRLA, EOMI, no exophthalmos ENT: moist mucous membranes, no thyromegaly, no cervical lymphadenopathy Cardiovascular: RRR, No MRG Respiratory: CTA B Gastrointestinal: abdomen soft, NT, ND, BS+ Musculoskeletal: no deformities, strength intact in all 4 Skin: moist, warm, no rashes Neurological: no tremor with outstretched hands, DTR normal in all 4  ASSESSMENT: 1. Hypothyroidism  2. Thyroid nodules  PLAN:  1. Patient with long-standing hypothyroidism, on levothyroxine therapy.  Her dose was increased in 07/2017 after TSH returned elevated.  However, subsequent TSH was suppressed, so her dose was reduced back to 88 mcg daily, on which she was for a long time with good control, reportedly. - she appears euthyroid.  - she does not appear to have a goiter, thyroid nodules, or neck compression symptoms - We discussed about correct intake of levothyroxine, fasting, with water, separated by at least 30 minutes from breakfast, and separated by more than 4 hours from calcium, iron, multivitamins, acid reflux medications (PPIs).  She is taking multivitamins with breakfast, otherwise taking the levothyroxine correctly.  I advised her to move the multivitamins at least 4 hours after levothyroxine. - will check thyroid tests in 5 to 6 weeks after the above change: TSH, free T4, free T3, and also thyroid antibodies to screen for Hashimoto's thyroiditis.  I explained that this is an autoimmune disease that  attacks her thyroid.  If the antibodies are elevated, selenium supplementation is an option. - I will see her back in 6 months  2. Thyroid nodules  - she has no neck compression symptoms - Reviewed both thyroid ultrasound reports, from 2012 and 2018: She has a history of pseudo-nodules, which are inflammatory nodules most likely in the setting of thyroiditis.  These are usually benign and since they are so small and not worrisome, I would not recommend any further thyroid ultrasounds unless she starts complaining of neck compression symptoms.  Orders Placed This Encounter  Procedures  . TSH  . T4, free  . T3, free  . Thyroglobulin antibody  . Thyroid peroxidase antibody   Component     Latest Ref Rng & Units 11/14/2017  TSH     0.35 - 4.50 uIU/mL 0.07 (L)  T4,Free(Direct)     0.60 - 1.60 ng/dL 1.16  Thyroperoxidase Ab SerPl-aCnc     <9 IU/mL 54 (H)  Thyroglobulin Ab     < or = 1 IU/mL <1  Triiodothyronine,Free,Serum     2.3 - 4.2 pg/mL 3.1   TSH suppressed, so we will need to reduce her levothyroxine dose further, to 75 mcg daily.  We will have her back for repeat set of TFTs in 1.5 months.  TPO antibodies are elevated, confirming Hashimoto's thyroiditis.  Selenium, 200 mcg daily can help decrease the antibodies.  Philemon Kingdom, MD PhD [facility] Endocrinology

## 2017-10-12 ENCOUNTER — Ambulatory Visit: Payer: 59 | Admitting: Internal Medicine

## 2017-10-12 ENCOUNTER — Encounter: Payer: Self-pay | Admitting: Internal Medicine

## 2017-10-12 VITALS — BP 118/84 | HR 95 | Temp 98.2°F | Ht 62.0 in | Wt 127.0 lb

## 2017-10-12 DIAGNOSIS — M1811 Unilateral primary osteoarthritis of first carpometacarpal joint, right hand: Secondary | ICD-10-CM | POA: Diagnosis not present

## 2017-10-12 DIAGNOSIS — M1812 Unilateral primary osteoarthritis of first carpometacarpal joint, left hand: Secondary | ICD-10-CM

## 2017-10-12 DIAGNOSIS — M180 Bilateral primary osteoarthritis of first carpometacarpal joints: Secondary | ICD-10-CM

## 2017-10-12 MED ORDER — DICLOFENAC SODIUM 1 % TD GEL: 2.0000 g | Freq: Four times a day (QID) | TRANSDERMAL | 1 refills | Status: DC

## 2017-10-12 NOTE — Progress Notes (Signed)
Subjective:    Patient ID: Tamara Mccoy, female    DOB: 1965-12-18, 52 y.o.   MRN: [id number]  HPI Here due to ongoing hand problems  Did have injury at work---right thumb locked up.  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 buPROPion (WELLBUTRIN XL) 150 MG 24 hr tablet TAKE 1 TABLET(150 MG) BY MOUTH DAILY 30 tablet 11  . fluticasone (FLONASE) 50 MCG/ACT nasal spray Place into both nostrils daily.    Marland Kitchen levonorgestrel (MIRENA) 20 MCG/24HR IUD 1 each by Intrauterine route once. Inserted 02/2016    . Levothyroxine Sodium 88 MCG CAPS Take by mouth daily before breakfast.    . Multiple Vitamin (MULTIVITAMIN) tablet Take 1 tablet by mouth daily.    . rizatriptan (MAXALT-MLT) 10 MG disintegrating tablet Take 10 mg by mouth.     No current facility-administered medications on file prior to visit.     Allergies  Allergen Reactions  . Contrast Media [Iodinated Diagnostic Agents] Hives  . Erythromycin   . Iohexol      Code: HIVES, Desc: Hives on face post injection of 100cc's Omni., Onset Date: [id number]   . Zithromax [Azithromycin]   . Penicillins Rash    Past Medical History:  Diagnosis Date  . Allergy   . Anxiety   . Depression   . History of chicken pox   . Hypothyroidism   . Migraine     Past Surgical History:  Procedure Laterality Date  . COLONOSCOPY    . WISDOM TOOTH EXTRACTION      Family History  Problem Relation Age of  Onset  . COPD Mother   . Kidney failure Mother        only one working   . Heart Problems Mother        stent   . Uterine cancer Mother   . Heart block Father   . Prostate cancer Father   . Leukemia Maternal Grandmother   . Leukemia Maternal Uncle   . Heart attack Maternal Grandfather   . Heart Problems Paternal Grandmother        bypass  . Prostate cancer Paternal Grandfather   . Colon cancer Neg Hx   . Colon polyps Neg Hx   . Esophageal cancer Neg Hx   . Stomach cancer Neg Hx   . Rectal cancer Neg Hx     Social History   Socioeconomic History  . Marital status: Married    Spouse name: Octavia Bruckner  . Number of children: 3  . Years of education: 39  . Highest education level: Not on file  Occupational History    Employer: [employer]  Social Needs  . Financial resource strain: Not on file  . Food insecurity:    Worry: Not on file    Inability: Not on file  . Transportation needs:    Medical: Not on file    Non-medical: Not on file  Tobacco Use  . Smoking status: Never Smoker  .  Smokeless tobacco: Never Used  Substance and Sexual Activity  . Alcohol use: Yes    Comment: occasional  . Drug use: No  . Sexual activity: Not Currently  Lifestyle  . Physical activity:    Days per week: Not on file    Minutes per session: Not on file  . Stress: Not on file  Relationships  . Social connections:    Talks on phone: Not on file    Gets together: Not on file    Attends religious service: Not on file    Active member of club or organization: Not on file    Attends meetings of clubs or organizations: Not on file    Relationship status: Not on file  . Intimate partner violence:    Fear of current or ex partner: Not on file    Emotionally abused: Not on file    Physically abused: Not on file    Forced sexual activity: Not on file  Other Topics Concern  . Not on file  Social History Narrative   Patient lives at home with husband Octavia Bruckner and one son.    Patient has 3  children.    Patient has some college.    Patient works at [employer].    Review of Systems  No other joint swelling No fevers No weight loss     Objective:   Physical Exam  Constitutional: She appears well-developed. No distress.  Musculoskeletal:  No thenar atrophy No tenderness or active synovitis in hands   Neurological:  No hand weakness Normal grips           Assessment & Plan:

## 2017-10-12 NOTE — Assessment & Plan Note (Addendum)
Right more than left by far---dominant hand is favored in almost all her work functions Discussed trying 2 aleve before work Try topical diclofenac Consider topical Rx (wax, lidocaine)  Also mild CTS on right---but symptoms controlled with brace at night

## 2017-10-12 NOTE — Patient Instructions (Signed)
Try the diclofenac gel on your thumb. If that isn't helping, you can increase the aleve to 2 tabs daily before work. You can also try over the counter lidocaine patch or gel on the painful spot.

## 2017-10-22 ENCOUNTER — Encounter: Payer: 59 | Admitting: Gastroenterology

## 2017-10-22 ENCOUNTER — Telehealth: Payer: Self-pay

## 2017-10-22 NOTE — Telephone Encounter (Signed)
Copied from [place] [id number]. Topic: General - Other >> Oct 22, 2017 11:03 AM Oneta Rack wrote: Relation to pt: self Call back number:[phone] Pharmacy: [place], [address] AT [address] [phone] (Phone) [phone] (Fax)  Reason for call:  [facility] informed patient insurance will not cover diclofenac sodium (VOLTAREN) 1 % GEL and PA is needed. Pharmacy advised PCP was notified last week (chart doesn't reflect). Patient states she would like to know the status of the gel or if alternate will be sent over, patient seeking a follow up call today., please advise >> Oct 22, 2017 11:06 AM Oneta Rack wrote: Relation to pt: self Call back number:[phone] Pharmacy: [place], [address] AT [address] [phone] (Phone) [phone] (Fax)  Reason for call:  [facility] informed patient insurance will not cover diclofenac sodium (VOLTAREN) 1 % GEL and PA is needed. Pharmacy advised PCP was notified last week (chart doesn't reflect). Patient states she would like to know the status of the gel or if alternate will be sent over, patient seeking a follow up call today., please advise

## 2017-10-22 NOTE — Telephone Encounter (Signed)
PA has been completed and has been approved through coverage [id number] End Date:10/22/2018 pt is aware

## 2017-10-29 ENCOUNTER — Other Ambulatory Visit: Payer: Self-pay | Admitting: Internal Medicine

## 2017-10-30 ENCOUNTER — Encounter: Payer: Self-pay | Admitting: Internal Medicine

## 2017-10-30 ENCOUNTER — Ambulatory Visit: Payer: 59 | Admitting: Internal Medicine

## 2017-10-30 VITALS — BP 122/78 | HR 76 | Temp 98.6°F | Wt 129.0 lb

## 2017-10-30 DIAGNOSIS — R1010 Upper abdominal pain, unspecified: Secondary | ICD-10-CM

## 2017-10-30 MED ORDER — RIZATRIPTAN BENZOATE 10 MG PO TBDP: 10.0000 mg | ORAL_TABLET | ORAL | 2 refills | Status: DC | PRN

## 2017-10-30 NOTE — Patient Instructions (Signed)

## 2017-10-30 NOTE — Progress Notes (Signed)
Subjective:    Patient ID: Tamara Mccoy, female    DOB: [birth year], 52 y.o.   MRN: [id number]  HPI  Pt presents to the clinic today with c/o abdominal pain. She reports she woke up yesterday morning with it. She describes the pain as tightness. The pain does not radiate. It does seem worse with movement. She denies nausea, vomiting, constipation or diarrhea. She denies any injury to the area.  She hasn't lifted anything that she is aware of. She denies fever, chills or body aches. She denies rash. She has not taken anything OTC for her symptoms.   Review of Systems      Past Medical History:  Diagnosis Date  . Allergy   . Anxiety   . Depression   . History of chicken pox   . Hypothyroidism   . Migraine     Current Outpatient Medications  Medication Sig Dispense Refill  . ALPRAZolam (XANAX) 0.25 MG tablet Take 0.25 mg by mouth daily as needed for anxiety.    Marland Kitchen buPROPion (WELLBUTRIN XL) 150 MG 24 hr tablet TAKE 1 TABLET(150 MG) BY MOUTH DAILY 30 tablet 11  . diclofenac sodium (VOLTAREN) 1 % GEL Apply 2 g topically 4 (four) times daily. 300 g 1  . fluticasone (FLONASE) 50 MCG/ACT nasal spray Place into both nostrils daily.    Marland Kitchen levonorgestrel (MIRENA) 20 MCG/24HR IUD 1 each by Intrauterine route once. Inserted 02/2016    . Levothyroxine Sodium 88 MCG CAPS Take by mouth daily before breakfast.    . Multiple Vitamin (MULTIVITAMIN) tablet Take 1 tablet by mouth daily.    . rizatriptan (MAXALT-MLT) 10 MG disintegrating tablet Take 1 tablet (10 mg total) by mouth as needed for migraine. 10 tablet 2   No current facility-administered medications for this visit.     Allergies  Allergen Reactions  . Contrast Media [Iodinated Diagnostic Agents] Hives  . Erythromycin   . Iohexol      Code: HIVES, Desc: Hives on face post injection of 100cc's Omni., Onset Date: [id number]   . Zithromax [Azithromycin]   . Penicillins Rash    Family History  Problem Relation Age of Onset  .  COPD Mother   . Kidney failure Mother        only one working   . Heart Problems Mother        stent   . Uterine cancer Mother   . Heart block Father   . Prostate cancer Father   . Leukemia Maternal Grandmother   . Leukemia Maternal Uncle   . Heart attack Maternal Grandfather   . Heart Problems Paternal Grandmother        bypass  . Prostate cancer Paternal Grandfather   . Colon cancer Neg Hx   . Colon polyps Neg Hx   . Esophageal cancer Neg Hx   . Stomach cancer Neg Hx   . Rectal cancer Neg Hx     Social History   Socioeconomic History  . Marital status: Married    Spouse name: Tamara Mccoy  . Number of children: 3  . Years of education: 67  . Highest education level: Not on file  Occupational History    Employer: [employer]  Social Needs  . Financial resource strain: Not on file  . Food insecurity:    Worry: Not on file    Inability: Not on file  . Transportation needs:    Medical: Not on file    Non-medical: Not on file  Tobacco  Use  . Smoking status: Never Smoker  . Smokeless tobacco: Never Used  Substance and Sexual Activity  . Alcohol use: Yes    Comment: occasional  . Drug use: No  . Sexual activity: Not Currently  Lifestyle  . Physical activity:    Days per week: Not on file    Minutes per session: Not on file  . Stress: Not on file  Relationships  . Social connections:    Talks on phone: Not on file    Gets together: Not on file    Attends religious service: Not on file    Active member of club or organization: Not on file    Attends meetings of clubs or organizations: Not on file    Relationship status: Not on file  . Intimate partner violence:    Fear of current or ex partner: Not on file    Emotionally abused: Not on file    Physically abused: Not on file    Forced sexual activity: Not on file  Other Topics Concern  . Not on file  Social History Narrative   Patient lives at home with husband Tamara Mccoy and one son.    Patient has 3 children.     Patient has some college.    Patient works at [employer].      Constitutional: Denies fever, malaise, fatigue, headache or abrupt weight changes.  Respiratory: Denies difficulty breathing, shortness of breath, cough or sputum production.   Cardiovascular: Denies chest pain, chest tightness, palpitations or swelling in the hands or feet.  Gastrointestinal: Pt reports abdominal pain. Denies bloating, constipation, diarrhea or blood in the stool.  GU: Denies urgency, frequency, pain with urination, burning sensation, blood in urine, odor or discharge. Musculoskeletal: Denies decrease in range of motion, difficulty with gait, muscle pain or joint pain and swelling.  Skin: Denies redness, rashes, lesions or ulcercations.    No other specific complaints in a complete review of systems (except as listed in HPI above).  Objective:   Physical Exam   BP 122/78   Pulse 76   Temp 98.6 F (37 C) (Oral)   Wt 129 lb (58.5 kg)   SpO2 98%   BMI 23.59 kg/m  Wt Readings from Last 3 Encounters:  10/30/17 129 lb (58.5 kg)  10/12/17 127 lb (57.6 kg)  10/10/17 128 lb 12.8 oz (58.4 kg)    General: Appears her stated age, well developed, well nourished in NAD. Skin: Warm, dry and intact. No rashes noted. Cardiovascular: Normal rate and rhythm. S1,S2 noted.  No murmur, rubs or gallops noted.  Pulmonary/Chest: Normal effort and positive vesicular breath sounds. No respiratory distress. No wheezes, rales or ronchi noted.  Abdomen: Soft and nontender. Normal bowel sounds. No distention or masses noted.  Musculoskeletal: Abdominal musculature nontender with palpation. Neurological: Alert and oriented.   BMET    Component Value Date/Time   NA 139 09/12/2017 0814   K 4.6 09/12/2017 0814   CL 106 09/12/2017 0814   CO2 28 09/12/2017 0814   GLUCOSE 103 (H) 09/12/2017 0814   BUN 17 09/12/2017 0814   CREATININE 0.81 09/12/2017 0814   CALCIUM 9.2 09/12/2017 0814    Lipid Panel     Component Value  Date/Time   CHOL 199 08/08/2017 1545   TRIG 76.0 08/08/2017 1545   HDL 79.40 08/08/2017 1545   CHOLHDL 3 08/08/2017 1545   VLDL 15.2 08/08/2017 1545   LDLCALC 105 (H) 08/08/2017 1545    CBC    Component Value  Date/Time   WBC 11.0 (H) 08/08/2017 1545   RBC 4.42 08/08/2017 1545   HGB 13.5 08/08/2017 1545   HCT 41.1 08/08/2017 1545   PLT 336.0 08/08/2017 1545   MCV 92.9 08/08/2017 1545   MCHC 32.8 08/08/2017 1545   RDW 13.8 08/08/2017 1545    Hgb A1C No results found for: HGBA1C         Assessment & Plan:   Abdominal Pain:  She thinks this is muscular Exam benign Encouraged heat, stretching Ok to take Ibuprofen or use Diclofenac gel over the area as needed  Return precautions discussed Webb Silversmith, NP

## 2017-10-30 NOTE — Telephone Encounter (Signed)
Last filled 06/05/2017.Marland KitchenMarland Kitchenplease advise

## 2017-11-14 ENCOUNTER — Other Ambulatory Visit (INDEPENDENT_AMBULATORY_CARE_PROVIDER_SITE_OTHER): Payer: 59

## 2017-11-14 DIAGNOSIS — E039 Hypothyroidism, unspecified: Secondary | ICD-10-CM

## 2017-11-14 LAB — T3, FREE: T3 FREE: 3.1 pg/mL (ref 2.3–4.2)

## 2017-11-14 LAB — T4, FREE: FREE T4: 1.16 ng/dL (ref 0.60–1.60)

## 2017-11-14 LAB — TSH: TSH: 0.07 u[IU]/mL — ABNORMAL LOW (ref 0.35–4.50)

## 2017-11-15 LAB — THYROGLOBULIN ANTIBODY: Thyroglobulin Ab: <1 IU/mL (ref <=1)

## 2017-11-15 LAB — THYROID PEROXIDASE ANTIBODY: Thyroperoxidase Ab SerPl-aCnc: 54 IU/mL — ABNORMAL HIGH (ref <9)

## 2017-11-16 MED ORDER — LEVOTHYROXINE SODIUM 75 MCG PO TABS: 75.0000 ug | ORAL_TABLET | Freq: Every day | ORAL | 2 refills | Status: DC

## 2017-11-16 NOTE — Addendum Note (Signed)
Addended by: Philemon Kingdom on: 11/16/2017 09:03 AM   Modules accepted: Orders

## 2017-12-17 ENCOUNTER — Telehealth: Payer: Self-pay | Admitting: Emergency Medicine

## 2017-12-17 NOTE — Telephone Encounter (Signed)
Pt is aware.  

## 2017-12-17 NOTE — Telephone Encounter (Signed)
Okay to come tomorrow.  Labs are in.

## 2017-12-17 NOTE — Telephone Encounter (Signed)
Pt called and stated she isnt feeling well and doesn't have the energy she used to have. She is wondering if she needs to come in and have her blood work done sooner. Please advise thanks.

## 2017-12-17 NOTE — Telephone Encounter (Signed)
Please advise on below  

## 2017-12-18 ENCOUNTER — Other Ambulatory Visit (INDEPENDENT_AMBULATORY_CARE_PROVIDER_SITE_OTHER): Payer: 59

## 2017-12-18 DIAGNOSIS — E039 Hypothyroidism, unspecified: Secondary | ICD-10-CM | POA: Diagnosis not present

## 2017-12-18 LAB — TSH: TSH: 0.65 u[IU]/mL (ref 0.35–4.50)

## 2017-12-18 LAB — T4, FREE: FREE T4: 0.9 ng/dL (ref 0.60–1.60)

## 2017-12-31 ENCOUNTER — Ambulatory Visit: Payer: 59 | Admitting: Internal Medicine

## 2017-12-31 DIAGNOSIS — Z0289 Encounter for other administrative examinations: Secondary | ICD-10-CM

## 2018-01-04 ENCOUNTER — Encounter: Payer: Self-pay | Admitting: Primary Care

## 2018-01-04 ENCOUNTER — Ambulatory Visit (INDEPENDENT_AMBULATORY_CARE_PROVIDER_SITE_OTHER): Payer: 59 | Admitting: Primary Care

## 2018-01-04 VITALS — BP 122/80 | HR 83 | Temp 98.4°F | Wt 130.0 lb

## 2018-01-04 DIAGNOSIS — T148XXA Other injury of unspecified body region, initial encounter: Secondary | ICD-10-CM | POA: Diagnosis not present

## 2018-01-04 MED ORDER — CYCLOBENZAPRINE HCL 5 MG PO TABS
5.0000 mg | ORAL_TABLET | Freq: Every evening | ORAL | 0 refills | Status: DC | PRN
Start: 2018-01-04 — End: 2018-02-19

## 2018-01-04 NOTE — Patient Instructions (Signed)
Continue Aleve as needed.  You may take the cyclobenzaprine tablets at bedtime for muscle tightness/spasms. Take 1-2 tablets at bedtime.  Continue stretching and advance activity (hiking) as tolerated.  Please notify us if you do not continue to experience improvement.   It was a pleasure meeting you!

## 2018-01-04 NOTE — Progress Notes (Signed)
Subjective:    Patient ID: Tamara Mccoy, female    DOB: [birth year], 52 y.o.   MRN: [id number]  HPI  Tamara Mccoy is a 52 year old female who presents today with a chief complaint of lower extremity pain.  Her pain is located to the right lower extremity under her buttocks and posterior thigh. Her symptoms began 6 weeks ago after running through a field. She felt an immediate tightening and pain. Her pain is worse with prolonged sitting and walking upstairs. She's tried applying ice and taking Aleve with some improvement.  Overall she thinks her symptoms have improved but is without resolve. She was able to hike 3 miles last weekend without problems. She was driving to [place] 5 weeks ago (after the incident) and noticed some tingling to her toes (right foot), resolved after moving her leg. She has been on several long trips since then without symptoms. She denies back pain, radiculopathy, weakness.    Review of Systems  Musculoskeletal: Positive for myalgias.       Right posterior thigh and gluteal tightness  Skin: Negative for color change.  Neurological: Negative for weakness and numbness.       Past Medical History:  Diagnosis Date  . Allergy   . Anxiety   . Depression   . History of chicken pox   . Hypothyroidism   . Migraine      Social History   Socioeconomic History  . Marital status: Married    Spouse name: Tamara Mccoy  . Number of children: 3  . Years of education: 37  . Highest education level: Not on file  Occupational History    Employer: [employer]  Social Needs  . Financial resource strain: Not on file  . Food insecurity:    Worry: Not on file    Inability: Not on file  . Transportation needs:    Medical: Not on file    Non-medical: Not on file  Tobacco Use  . Smoking status: Never Smoker  . Smokeless tobacco: Never Used  Substance and Sexual Activity  . Alcohol use: Yes    Comment: occasional  . Drug use: No  . Sexual activity: Not  Currently  Lifestyle  . Physical activity:    Days per week: Not on file    Minutes per session: Not on file  . Stress: Not on file  Relationships  . Social connections:    Talks on phone: Not on file    Gets together: Not on file    Attends religious service: Not on file    Active member of club or organization: Not on file    Attends meetings of clubs or organizations: Not on file    Relationship status: Not on file  . Intimate partner violence:    Fear of current or ex partner: Not on file    Emotionally abused: Not on file    Physically abused: Not on file    Forced sexual activity: Not on file  Other Topics Concern  . Not on file  Social History Narrative   Patient lives at home with husband Tamara Mccoy and one son.    Patient has 3 children.    Patient has some college.    Patient works at [employer].     Past Surgical History:  Procedure Laterality Date  . COLONOSCOPY    . WISDOM TOOTH EXTRACTION      Family History  Problem Relation Age of Onset  . COPD Mother   .  Kidney failure Mother        only one working   . Heart Problems Mother        stent   . Uterine cancer Mother   . Heart block Father   . Prostate cancer Father   . Leukemia Maternal Grandmother   . Leukemia Maternal Uncle   . Heart attack Maternal Grandfather   . Heart Problems Paternal Grandmother        bypass  . Prostate cancer Paternal Grandfather   . Colon cancer Neg Hx   . Colon polyps Neg Hx   . Esophageal cancer Neg Hx   . Stomach cancer Neg Hx   . Rectal cancer Neg Hx     Allergies  Allergen Reactions  . Contrast Media [Iodinated Diagnostic Agents] Hives  . Erythromycin   . Iohexol      Code: HIVES, Desc: Hives on face post injection of 100cc's Omni., Onset Date: [id number]   . Zithromax [Azithromycin]   . Penicillins Rash    Current Outpatient Medications on File Prior to Visit  Medication Sig Dispense Refill  . ALPRAZolam (XANAX) 0.25 MG tablet TAKE 1 TABLET(0.25 MG) BY MOUTH TWICE  DAILY AS NEEDED FOR ANXIETY 20 tablet 0  . buPROPion (WELLBUTRIN XL) 150 MG 24 hr tablet TAKE 1 TABLET(150 MG) BY MOUTH DAILY 30 tablet 11  . diclofenac sodium (VOLTAREN) 1 % GEL Apply 2 g topically 4 (four) times daily. 300 g 1  . fluticasone (FLONASE) 50 MCG/ACT nasal spray Place into both nostrils daily.    Marland Kitchen levonorgestrel (MIRENA) 20 MCG/24HR IUD 1 each by Intrauterine route once. Inserted 02/2016    . levothyroxine (SYNTHROID, LEVOTHROID) 75 MCG tablet Take 1 tablet (75 mcg total) by mouth daily before breakfast. 60 tablet 2  . Multiple Vitamin (MULTIVITAMIN) tablet Take 1 tablet by mouth daily.    . rizatriptan (MAXALT-MLT) 10 MG disintegrating tablet Take 1 tablet (10 mg total) by mouth as needed for migraine. 10 tablet 2   No current facility-administered medications on file prior to visit.     BP 122/80   Pulse 83   Temp 98.4 F (36.9 C) (Oral)   Wt 130 lb (59 kg)   SpO2 98%   BMI 23.78 kg/m    Objective:   Physical Exam  Constitutional: She appears well-nourished.  Cardiovascular: Normal rate.  Respiratory: Effort normal.  Musculoskeletal:       Lumbar back: She exhibits normal range of motion, no tenderness and no pain.       Legs: 5/5 strength to bilateral lower extremities. Negative straight leg raise.   Skin: Skin is warm and dry.           Assessment & Plan:  Muscle Strain:  Located to right posterior thigh distal to buttocks. No alarm signs, exam overall unremarkable.  Improvement with Aleve, ice, and stretching. Continue current treatment, add in Flexeril HS PRN. Discussed to increase activity as tolerated.   Pleas Koch, NP

## 2018-01-08 ENCOUNTER — Other Ambulatory Visit: Payer: 59

## 2018-02-12 ENCOUNTER — Ambulatory Visit: Payer: 59 | Admitting: Internal Medicine

## 2018-02-13 ENCOUNTER — Encounter: Payer: Self-pay | Admitting: Internal Medicine

## 2018-02-13 ENCOUNTER — Ambulatory Visit: Payer: 59 | Admitting: Internal Medicine

## 2018-02-13 VITALS — BP 122/78 | HR 94 | Temp 98.6°F | Wt 129.0 lb

## 2018-02-13 DIAGNOSIS — R0789 Other chest pain: Secondary | ICD-10-CM | POA: Diagnosis not present

## 2018-02-13 DIAGNOSIS — Z23 Encounter for immunization: Secondary | ICD-10-CM

## 2018-02-13 MED ORDER — NAPROXEN 500 MG PO TABS: 500.0000 mg | ORAL_TABLET | Freq: Two times a day (BID) | ORAL | 0 refills | Status: DC

## 2018-02-13 NOTE — Progress Notes (Signed)
Subjective:    Patient ID: Tamara Mccoy, female    DOB: Mar 13, 1966, 52 y.o.   MRN: [id number]  HPI  Pt presents to the clinic today with c/o left upper breast pain. She describes the pain as sore. The pain radiates into her left upper arm. She denies skin changes of the breast or discharge from the nipple. She denies chest pain, chest tightness or shortness of breath.  Her last mammogram was 01/2018 and she reports she was not having pain before.  Review of Systems      Past Medical History:  Diagnosis Date  . Allergy   . Anxiety   . Depression   . History of chicken pox   . Hypothyroidism   . Migraine     Current Outpatient Medications  Medication Sig Dispense Refill  . ALPRAZolam (XANAX) 0.25 MG tablet TAKE 1 TABLET(0.25 MG) BY MOUTH TWICE DAILY AS NEEDED FOR ANXIETY 20 tablet 0  . buPROPion (WELLBUTRIN XL) 150 MG 24 hr tablet TAKE 1 TABLET(150 MG) BY MOUTH DAILY 30 tablet 11  . cyclobenzaprine (FLEXERIL) 5 MG tablet Take 1-2 tablets (5-10 mg total) by mouth at bedtime as needed for muscle spasms. 30 tablet 0  . diclofenac sodium (VOLTAREN) 1 % GEL Apply 2 g topically 4 (four) times daily. 300 g 1  . fluticasone (FLONASE) 50 MCG/ACT nasal spray Place into both nostrils daily.    Marland Kitchen levonorgestrel (MIRENA) 20 MCG/24HR IUD 1 each by Intrauterine route once. Inserted 02/2016    . levothyroxine (SYNTHROID, LEVOTHROID) 75 MCG tablet Take 1 tablet (75 mcg total) by mouth daily before breakfast. 60 tablet 2  . Multiple Vitamin (MULTIVITAMIN) tablet Take 1 tablet by mouth daily.    . rizatriptan (MAXALT-MLT) 10 MG disintegrating tablet Take 1 tablet (10 mg total) by mouth as needed for migraine. 10 tablet 2   No current facility-administered medications for this visit.     Allergies  Allergen Reactions  . Contrast Media [Iodinated Diagnostic Agents] Hives  . Erythromycin   . Iohexol      Code: HIVES, Desc: Hives on face post injection of 100cc's Omni., Onset Date: [id number]   . Zithromax [Azithromycin]   . Penicillins Rash    Family History  Problem Relation Age of Onset  . COPD Mother   . Kidney failure Mother        only one working   . Heart Problems Mother        stent   . Uterine cancer Mother   . Heart block Father   . Prostate cancer Father   . Leukemia Maternal Grandmother   . Leukemia Maternal Uncle   . Heart attack Maternal Grandfather   . Heart Problems Paternal Grandmother        bypass  . Prostate cancer Paternal Grandfather   . Colon cancer Neg Hx   . Colon polyps Neg Hx   . Esophageal cancer Neg Hx   . Stomach cancer Neg Hx   . Rectal cancer Neg Hx     Social History   Socioeconomic History  . Marital status: Married    Spouse name: Octavia Bruckner  . Number of children: 3  . Years of education: 72  . Highest education level: Not on file  Occupational History    Employer: [employer]  Social Needs  . Financial resource strain: Not on file  . Food insecurity:    Worry: Not on file    Inability: Not on file  . Transportation  needs:    Medical: Not on file    Non-medical: Not on file  Tobacco Use  . Smoking status: Never Smoker  . Smokeless tobacco: Never Used  Substance and Sexual Activity  . Alcohol use: Yes    Comment: occasional  . Drug use: No  . Sexual activity: Not Currently  Lifestyle  . Physical activity:    Days per week: Not on file    Minutes per session: Not on file  . Stress: Not on file  Relationships  . Social connections:    Talks on phone: Not on file    Gets together: Not on file    Attends religious service: Not on file    Active member of club or organization: Not on file    Attends meetings of clubs or organizations: Not on file    Relationship status: Not on file  . Intimate partner violence:    Fear of current or ex partner: Not on file    Emotionally abused: Not on file    Physically abused: Not on file    Forced sexual activity: Not on file  Other Topics Concern  . Not on file  Social  History Narrative   Patient lives at home with husband Octavia Bruckner and one son.    Patient has 3 children.    Patient has some college.    Patient works at [employer].      Constitutional: Denies fever, malaise, fatigue, headache or abrupt weight changes.  Respiratory: Denies difficulty breathing, shortness of breath, cough or sputum production.   Cardiovascular: Denies chest pain, chest tightness, palpitations or swelling in the hands or feet.  Musculoskeletal: Pt reports left upper chest wall pain. Denies decrease in range of motion, difficulty with gait,  or joint pain and swelling.  Skin: Denies redness, rashes, lesions or ulcercations.    No other specific complaints in a complete review of systems (except as listed in HPI above).  Objective:   Physical Exam  BP 122/78   Pulse 94   Temp 98.6 F (37 C) (Oral)   Wt 129 lb (58.5 kg)   SpO2 98%   BMI 23.59 kg/m  Wt Readings from Last 3 Encounters:  02/13/18 129 lb (58.5 kg)  01/04/18 130 lb (59 kg)  10/30/17 129 lb (58.5 kg)    General: Appears her stated age, well developed, well nourished in NAD. Skin: Breasts symmetrical. No changes in skin of breast. No masses noted of left breast. Cardiovascular: Normal rate and rhythm. S1,S2 noted.  No murmur, rubs or gallops noted.  Pulmonary/Chest: Normal effort and positive vesicular breath sounds. No respiratory distress. No wheezes, rales or ronchi noted.  Musculoskeletal: Left upper chest tender with palpation. Normal internal and external rotation of the left shoulder. Strength 5/5 BUE. Hand grips equal. Neurological: Alert and oriented.   BMET    Component Value Date/Time   NA 139 09/12/2017 0814   K 4.6 09/12/2017 0814   CL 106 09/12/2017 0814   CO2 28 09/12/2017 0814   GLUCOSE 103 (H) 09/12/2017 0814   BUN 17 09/12/2017 0814   CREATININE 0.81 09/12/2017 0814   CALCIUM 9.2 09/12/2017 0814    Lipid Panel     Component Value Date/Time   CHOL 199 08/08/2017 1545   TRIG 76.0  08/08/2017 1545   HDL 79.40 08/08/2017 1545   CHOLHDL 3 08/08/2017 1545   VLDL 15.2 08/08/2017 1545   LDLCALC 105 (H) 08/08/2017 1545    CBC    Component Value  Date/Time   WBC 11.0 (H) 08/08/2017 1545   RBC 4.42 08/08/2017 1545   HGB 13.5 08/08/2017 1545   HCT 41.1 08/08/2017 1545   PLT 336.0 08/08/2017 1545   MCV 92.9 08/08/2017 1545   MCHC 32.8 08/08/2017 1545   RDW 13.8 08/08/2017 1545    Hgb A1C No results found for: HGBA1C         Assessment & Plan:   Left Chest Wall Pain:  eRx for Naproxen 500 mg BID x 1 week Continue Flexeril at night Will check results of mammogram and notify you Let me know if not improving  Return precautions discussed Webb Silversmith, NP

## 2018-02-13 NOTE — Patient Instructions (Signed)

## 2018-02-14 DIAGNOSIS — Z23 Encounter for immunization: Secondary | ICD-10-CM

## 2018-02-19 ENCOUNTER — Other Ambulatory Visit: Payer: Self-pay | Admitting: Primary Care

## 2018-02-19 DIAGNOSIS — T148XXA Other injury of unspecified body region, initial encounter: Secondary | ICD-10-CM

## 2018-02-19 NOTE — Telephone Encounter (Signed)
Electronic refill request Cyclobenzaprine Last refill 01/04/18 #30 Last office visit 02/13/18

## 2018-02-27 ENCOUNTER — Other Ambulatory Visit: Payer: Self-pay | Admitting: Internal Medicine

## 2018-03-15 ENCOUNTER — Encounter (INDEPENDENT_AMBULATORY_CARE_PROVIDER_SITE_OTHER): Payer: Self-pay

## 2018-03-15 ENCOUNTER — Ambulatory Visit: Payer: 59 | Admitting: Internal Medicine

## 2018-03-15 ENCOUNTER — Encounter: Payer: Self-pay | Admitting: Internal Medicine

## 2018-03-15 VITALS — BP 120/82 | HR 88 | Temp 98.8°F | Wt 129.0 lb

## 2018-03-15 DIAGNOSIS — S76011D Strain of muscle, fascia and tendon of right hip, subsequent encounter: Secondary | ICD-10-CM

## 2018-03-15 DIAGNOSIS — S76311D Strain of muscle, fascia and tendon of the posterior muscle group at thigh level, right thigh, subsequent encounter: Secondary | ICD-10-CM

## 2018-03-15 NOTE — Progress Notes (Signed)
Subjective:    Patient ID: Tamara Mccoy, female    DOB: [birth year], 52 y.o.   MRN: [id number]  HPI  Pt presents to the clinic today with c/o persistent pain if her right buttock, right hamstring. She was initially seen for the same 8/16 by Allie Bossier NP. Her symptoms had started 6 weeks prior, after running through a field. Her pain is worse with prolonged sitting and driving. She denies back pain, numbness and tingling in the lower extremities or weakness. She was getting some improvement with Ibuprofen and ice. Flexeril was added to her regimen at night. She reports her pain has not gotten worse, but just doesn't seem to be going away. She has been stretching daily. She continues to take the Flexeril nightly because it helps her sleep through the night.  Review of Systems      Past Medical History:  Diagnosis Date  . Allergy   . Anxiety   . Depression   . History of chicken pox   . Hypothyroidism   . Migraine     Current Outpatient Medications  Medication Sig Dispense Refill  . buPROPion (WELLBUTRIN XL) 150 MG 24 hr tablet TAKE 1 TABLET(150 MG) BY MOUTH DAILY 30 tablet 11  . cyclobenzaprine (FLEXERIL) 5 MG tablet TAKE 1 TO 2 TABLETS(5 TO 10 MG) BY MOUTH AT BEDTIME AS NEEDED FOR MUSCLE SPASMS 30 tablet 0  . diclofenac sodium (VOLTAREN) 1 % GEL Apply 2 g topically 4 (four) times daily. 300 g 1  . fluticasone (FLONASE) 50 MCG/ACT nasal spray Place into both nostrils daily.    Marland Kitchen levonorgestrel (MIRENA) 20 MCG/24HR IUD 1 each by Intrauterine route once. Inserted 02/2016    . levothyroxine (SYNTHROID, LEVOTHROID) 75 MCG tablet Take 1 tablet (75 mcg total) by mouth daily before breakfast. 60 tablet 2  . Multiple Vitamin (MULTIVITAMIN) tablet Take 1 tablet by mouth daily.    . naproxen (NAPROSYN) 500 MG tablet TAKE 1 TABLET(500 MG) BY MOUTH TWICE DAILY WITH A MEAL 14 tablet 0  . rizatriptan (MAXALT-MLT) 10 MG disintegrating tablet Take 1 tablet (10 mg total) by mouth as needed for  migraine. 10 tablet 2   No current facility-administered medications for this visit.     Allergies  Allergen Reactions  . Contrast Media [Iodinated Diagnostic Agents] Hives  . Erythromycin   . Iohexol      Code: HIVES, Desc: Hives on face post injection of 100cc's Omni., Onset Date: [id number]   . Zithromax [Azithromycin]   . Penicillins Rash    Family History  Problem Relation Age of Onset  . COPD Mother   . Kidney failure Mother        only one working   . Heart Problems Mother        stent   . Uterine cancer Mother   . Heart block Father   . Prostate cancer Father   . Leukemia Maternal Grandmother   . Leukemia Maternal Uncle   . Heart attack Maternal Grandfather   . Heart Problems Paternal Grandmother        bypass  . Prostate cancer Paternal Grandfather   . Colon cancer Neg Hx   . Colon polyps Neg Hx   . Esophageal cancer Neg Hx   . Stomach cancer Neg Hx   . Rectal cancer Neg Hx     Social History   Socioeconomic History  . Marital status: Married    Spouse name: Octavia Bruckner  . Number of children: 3  .  Years of education: 98  . Highest education level: Not on file  Occupational History    Employer: [employer]  Social Needs  . Financial resource strain: Not on file  . Food insecurity:    Worry: Not on file    Inability: Not on file  . Transportation needs:    Medical: Not on file    Non-medical: Not on file  Tobacco Use  . Smoking status: Never Smoker  . Smokeless tobacco: Never Used  Substance and Sexual Activity  . Alcohol use: Yes    Comment: occasional  . Drug use: No  . Sexual activity: Not Currently  Lifestyle  . Physical activity:    Days per week: Not on file    Minutes per session: Not on file  . Stress: Not on file  Relationships  . Social connections:    Talks on phone: Not on file    Gets together: Not on file    Attends religious service: Not on file    Active member of club or organization: Not on file    Attends meetings of clubs  or organizations: Not on file    Relationship status: Not on file  . Intimate partner violence:    Fear of current or ex partner: Not on file    Emotionally abused: Not on file    Physically abused: Not on file    Forced sexual activity: Not on file  Other Topics Concern  . Not on file  Social History Narrative   Patient lives at home with husband Octavia Bruckner and one son.    Patient has 3 children.    Patient has some college.    Patient works at [employer].      Constitutional: Denies fever, malaise, fatigue, headache or abrupt weight changes.  Musculoskeletal: Pt reports pain in right gluteal muscle, right hamstring. Denies decrease in range of motion, difficulty with gait, or joint pain and swelling.  Skin: Denies redness, rashes, lesions or ulcercations.  Neurological: Denies numbness, tingling, weakness or problems with balance and coordination.    No other specific complaints in a complete review of systems (except as listed in HPI above).  Objective:   Physical Exam  BP 120/82   Pulse 88   Temp 98.8 F (37.1 C) (Oral)   Wt 129 lb (58.5 kg)   SpO2 98%   BMI 23.59 kg/m  Wt Readings from Last 3 Encounters:  03/15/18 129 lb (58.5 kg)  02/13/18 129 lb (58.5 kg)  01/04/18 130 lb (59 kg)    General: Appears her stated age, well developed, well nourished in NAD. Skin: Warm, dry and intact. No rashes noted. Musculoskeletal: Normal flexion, extension, internal and external rotation of the right hip. Normal abduction and adduction of the right hip. No pain with palpation of the hip. Pain with palpation of the right distal gluteal muscle. No difficulty with gait.  Neurological: Alert and oriented. Sensation intact to BLE. Negative SLR on the right.   BMET    Component Value Date/Time   NA 139 09/12/2017 0814   K 4.6 09/12/2017 0814   CL 106 09/12/2017 0814   CO2 28 09/12/2017 0814   GLUCOSE 103 (H) 09/12/2017 0814   BUN 17 09/12/2017 0814   CREATININE 0.81 09/12/2017 0814    CALCIUM 9.2 09/12/2017 0814    Lipid Panel     Component Value Date/Time   CHOL 199 08/08/2017 1545   TRIG 76.0 08/08/2017 1545   HDL 79.40 08/08/2017 1545  CHOLHDL 3 08/08/2017 1545   VLDL 15.2 08/08/2017 1545   LDLCALC 105 (H) 08/08/2017 1545    CBC    Component Value Date/Time   WBC 11.0 (H) 08/08/2017 1545   RBC 4.42 08/08/2017 1545   HGB 13.5 08/08/2017 1545   HCT 41.1 08/08/2017 1545   PLT 336.0 08/08/2017 1545   MCV 92.9 08/08/2017 1545   MCHC 32.8 08/08/2017 1545   RDW 13.8 08/08/2017 1545    Hgb A1C No results found for: HGBA1C          Assessment & Plan:   Muscle Strain of Right Gluteal Muscle, Right Hamstring Strain:  I am concerned that she is using Flexeril more fore sleep than this muscle strain- we discussed this She will continue Aleve, stretching She will continue Flexeril until her bottle runs out Advised if she is having muscle pain or trouble sleeping at that time, to let me know Offered referral to PT but she declines at this time  Return precautions discussed Webb Silversmith, NP

## 2018-03-15 NOTE — Patient Instructions (Signed)
Gluteal Strain A gluteal strain happens when the muscles in the buttocks (gluteal muscles) are overstretched or torn. A tear can be partial or complete. A gluteal strain can cause pain and stiffness in your buttocks, legs, and lower back. A strain might be referred to as "pulling a muscle." The severity of a gluteal strain is rated in degrees. First-degree strains have the least amount of muscle tearing and pain. Second-degree and third-degrees strains have increasingly more tearing and pain. What are the causes? There are many possible causes of gluteal strain, such as:  Stretching the muscles too far.  Putting too much stress on the muscles before they are warmed up.  Overusing the muscles.  Repetitive muscle movements over long periods of time.  Injury.  What increases the risk? This condition is more likely to develop in:  People who are in cold weather.  People who are physically tired.  People with poor strength and flexibility.  People who do not warm up properly before physical activity.  People who do exercises or play sports with sudden bursts of activity.  People who have poor exercise technique.  What are the signs or symptoms? Symptoms of this condition include:  Pain in the buttocks, especially when moving the legs. Pain may spread to the lower back or the legs.  Bruising and swelling of the buttocks.  Tenderness, weakness, or stiffness in the buttocks.  Muscle spasms.  How is this diagnosed? This condition is diagnosed based on a physical exam and medical history. Your health care provider may do some range of motion exercises with you. You may have tests, such as MRI or X-rays. Your strain may be rated based on how severe it is. The ratings are:  Grade 1 strain (mild). Your muscles are overstretched. You might have very small muscle tears. This type of strain generally heals in about 1 week.  Grade 2 strain (moderate). Your muscles are partially torn.  This may take 1 to 2 months to heal.  Grade 3 strain (severe). Your muscles are completely torn. A severe strain can take more than three months to heal. Grade 3 gluteal strains are rare.  How is this treated? Treatment for this condition includes resting, icing, and raising (elevating) the injured area as much as possible. Your health care provider may recommend over-the-counter pain medicines. If your gluteal strain is severe or very painful, your health care provider may prescribe pain medicines or physical therapy. Surgery for severe strains is rare. Follow these instructions at home:  Take over-the-counter and prescription medicines only as told by your health care provider.  Do not drive or operate heavy machinery while taking prescription pain medicine.  Return to your normal activities as told by your health care provider. Ask your health care provider what activities are safe for you. ? Rest your gluteal muscles as much as possible, especially for the first 2-3 days. ? Begin exercising or stretching as told by your health care provider.  If directed, apply ice to the injured area: ? Put ice in a plastic bag. ? Place a towel between your skin and the bag. ? Leave the ice on for 20 minutes, 2-3 times per day.  Keep all follow-up visits as told by your health care provider. This is important. How is this prevented?  Warm up and stretch before physical activity.  Stretch after physical activity.  Learn and use correct techniques for exercising and playing sports.  Avoid difficult physical activities if your muscles are tired  or sore.  Strengthen your gluteal muscles and the surrounding muscles.  Develop your flexibility by stretching at least once a day. Contact a health care provider if:  You have pain or swelling that gets worse or does not get better with medicine.  You have "shooting" pain that moves through your leg. Get help right away if:  You develop weakness in  part of your body.  You lose feeling in part of your body.  You have severe pain.  You are unable to walk.  You have difficulty controlling your bladder or bowel movements. This information is not intended to replace advice given to you by your health care provider. Make sure you discuss any questions you have with your health care provider. Document Released: 03/05/2009 Document Revised: 10/14/2015 Document Reviewed: 09/23/2014 Elsevier Interactive Patient Education  2018 [employer].

## 2018-04-12 ENCOUNTER — Ambulatory Visit: Payer: 59 | Admitting: Internal Medicine

## 2018-04-24 ENCOUNTER — Encounter: Payer: Self-pay | Admitting: Internal Medicine

## 2018-04-24 ENCOUNTER — Ambulatory Visit: Payer: 59 | Admitting: Internal Medicine

## 2018-04-24 VITALS — BP 128/80 | HR 90 | Ht 62.0 in | Wt 131.0 lb

## 2018-04-24 DIAGNOSIS — Z8632 Personal history of gestational diabetes: Secondary | ICD-10-CM

## 2018-04-24 DIAGNOSIS — E038 Other specified hypothyroidism: Secondary | ICD-10-CM

## 2018-04-24 DIAGNOSIS — E063 Autoimmune thyroiditis: Secondary | ICD-10-CM | POA: Diagnosis not present

## 2018-04-24 DIAGNOSIS — E042 Nontoxic multinodular goiter: Secondary | ICD-10-CM | POA: Diagnosis not present

## 2018-04-24 LAB — HEMOGLOBIN A1C: HEMOGLOBIN A1C: 5.5 % (ref 4.6–6.5)

## 2018-04-24 LAB — T4, FREE: Free T4: 0.93 ng/dL (ref 0.60–1.60)

## 2018-04-24 LAB — TSH: TSH: 0.49 u[IU]/mL (ref 0.35–4.50)

## 2018-04-24 MED ORDER — LEVOTHYROXINE SODIUM 75 MCG PO TABS: 75.0000 ug | ORAL_TABLET | Freq: Every day | ORAL | 3 refills | Status: DC

## 2018-04-24 NOTE — Patient Instructions (Signed)
If you start Selenium, take 200 mcg daily.  For now, continue Levothyroxine 75 mcg daily.  Take the thyroid hormone every day, with water, at least 30 minutes before breakfast, separated by at least 4 hours from: - acid reflux medications - calcium - iron - multivitamins  Please come back for a follow-up appointment in 1 year and for labs in 6 months.

## 2018-04-24 NOTE — Progress Notes (Signed)
Will patient ID: Tamara Mccoy, female   DOB: [birth year], 52 y.o.   MRN: [id number]    HPI  Tamara Mccoy is a 52 y.o.-year-old female, returning for follow-up for Hashimoto's hypothyroidism and thyroid nodules.  He saw Dr. Posey Pronto before, at [facility] endocrinology.  Last visit 6 months ago.  Pt. has been dx with hypothyroidism in ~2014 >> on Levothyroxine 88 mcg (she was on this for a long time) at last visit, due to a suppressed TSH, we decreased the dose.  Pt is on levothyroxine 75 Mcg daily, taken: - in am - fasting - at least 30 min from b'fast - no Ca, Fe, PPIs - + MVI in the evening - rarely - not on Biotin  Reviewed patient's TFTs: Lab Results  Component Value Date   TSH 0.65 12/18/2017   TSH 0.07 (L) 11/14/2017   TSH 0.06 (L) 09/12/2017   TSH 6.92 (H) 08/08/2017   FREET4 0.90 12/18/2017   FREET4 1.16 11/14/2017   FREET4 0.94 09/12/2017   FREET4 0.78 08/08/2017   T3FREE 3.1 11/14/2017   ATA antibodies were negative: Lab Results  Component Value Date   THGAB <1 11/14/2017   TPO antibodies were high: Component     Latest Ref Rng & Units 11/14/2017  Thyroperoxidase Ab SerPl-aCnc     <9 IU/mL 54 (H)   Thyroid nodules:  Reviewed previous ultrasound reports: 07/2010: Right thyroid lobe:  3.9 x 1.1 x 1.4 cm Left thyroid lobe:  3.6 x 1.1 x 1.1 cm Isthmus:  5 mm Focal nodules:   Thyroid echotexture is heterogeneous.  No well- defined nodule or dominant mass identified.   Possible hypoechoic lesion measures 8 mm in the inter/lower pole right lobe on image 16. Suspect a hypoechoic nodule within the medial interpolar right lobe.  1.0 x 0.9 x 0.8 cm on image 9 image 26. A 5 mm hyperechoic nodule in the lower pole left lobe on image 41. A 7 mm hyperechoic upper pole left sided nodule. No abnormal calcifications. Lymphadenopathy:  None visualized.  10/2016 -thyroid ultrasound performed in Dr. Serita Grit office:   The thyroid nodule is fading away and is  currently measuring 0.5 x 0.3 cm. Repeat ultrasound in 2-3 years.   Pt denies: - feeling nodules in neck, but does not tolerate turtlenecks - hoarseness - dysphagia - choking - SOB with lying down  She has no FH of thyroid disorders. No FH of thyroid cancer. No h/o radiation tx to head or neck.  No seaweed or kelp. No recent contrast studies. No herbal supplements. No Biotin use. No recent steroids use.   Pt. also has a history of anxiety, depression (grief at losing husband in 05/2017).   ROS: Constitutional: no weight gain/no weight loss, no fatigue, + subjective hyperthermia, no subjective hypothermia Eyes: no blurry vision, no xerophthalmia ENT: no sore throat, + see HPI Cardiovascular: no CP/no SOB/no palpitations/no leg swelling Respiratory: no cough/no SOB/no wheezing Gastrointestinal: no N/no V/no D/+ C/no acid reflux Musculoskeletal: + muscle aches/+ joint aches Skin: no rashes, no hair loss Neurological: no tremors/no numbness/no tingling/no dizziness, + HA  I reviewed pt's medications, allergies, PMH, social hx, family hx, and changes were documented in the history of present illness. Otherwise, unchanged from my initial visit note.  Past Medical History:  Diagnosis Date  . Allergy   . Anxiety   . Depression   . History of chicken pox   . Hypothyroidism   . Migraine    Past Surgical History:  Procedure Laterality Date  . COLONOSCOPY    . WISDOM TOOTH EXTRACTION     Social History   Socioeconomic History  . Marital status: Married    Spouse name: Octavia Bruckner  . Number of children: 3  . Years of education: 54  . Highest education level: Not on file  Occupational History    Employer: [employer]  Social Needs  . Financial resource strain: Not on file  . Food insecurity:    Worry: Not on file    Inability: Not on file  . Transportation needs:    Medical: Not on file    Non-medical: Not on file  Tobacco Use  . Smoking status: Never Smoker  . Smokeless  tobacco: Never Used  Substance and Sexual Activity  . Alcohol use: Yes    Comment: occasional  . Drug use: No  . Sexual activity: Not Currently  Lifestyle  . Physical activity:    Days per week: Not on file    Minutes per session: Not on file  . Stress: Not on file  Relationships  . Social connections:    Talks on phone: Not on file    Gets together: Not on file    Attends religious service: Not on file    Active member of club or organization: Not on file    Attends meetings of clubs or organizations: Not on file    Relationship status: Not on file  . Intimate partner violence:    Fear of current or ex partner: Not on file    Emotionally abused: Not on file    Physically abused: Not on file    Forced sexual activity: Not on file  Other Topics Concern  . Not on file  Social History Narrative   Patient lives at home with husband Octavia Bruckner and one son.    Patient has 3 children.    Patient has some college.    Patient works at [employer].    Current Outpatient Medications on File Prior to Visit  Medication Sig Dispense Refill  . buPROPion (WELLBUTRIN XL) 150 MG 24 hr tablet TAKE 1 TABLET(150 MG) BY MOUTH DAILY 30 tablet 11  . cyclobenzaprine (FLEXERIL) 5 MG tablet TAKE 1 TO 2 TABLETS(5 TO 10 MG) BY MOUTH AT BEDTIME AS NEEDED FOR MUSCLE SPASMS 30 tablet 0  . diclofenac sodium (VOLTAREN) 1 % GEL Apply 2 g topically 4 (four) times daily. 300 g 1  . fluticasone (FLONASE) 50 MCG/ACT nasal spray Place into both nostrils daily.    Marland Kitchen levonorgestrel (MIRENA) 20 MCG/24HR IUD 1 each by Intrauterine route once. Inserted 02/2016    . levothyroxine (SYNTHROID, LEVOTHROID) 75 MCG tablet Take 1 tablet (75 mcg total) by mouth daily before breakfast. 60 tablet 2  . Multiple Vitamin (MULTIVITAMIN) tablet Take 1 tablet by mouth daily.    . naproxen (NAPROSYN) 500 MG tablet TAKE 1 TABLET(500 MG) BY MOUTH TWICE DAILY WITH A MEAL 14 tablet 0  . rizatriptan (MAXALT-MLT) 10 MG disintegrating tablet Take 1 tablet  (10 mg total) by mouth as needed for migraine. 10 tablet 2   No current facility-administered medications on file prior to visit.    Allergies  Allergen Reactions  . Contrast Media [Iodinated Diagnostic Agents] Hives  . Erythromycin   . Iohexol      Code: HIVES, Desc: Hives on face post injection of 100cc's Omni., Onset Date: 16109604   . Zithromax [Azithromycin]   . Penicillins Rash   Family History  Problem Relation Age  of Onset  . COPD Mother   . Kidney failure Mother        only one working   . Heart Problems Mother        stent   . Uterine cancer Mother   . Heart block Father   . Prostate cancer Father   . Leukemia Maternal Grandmother   . Leukemia Maternal Uncle   . Heart attack Maternal Grandfather   . Heart Problems Paternal Grandmother        bypass  . Prostate cancer Paternal Grandfather   . Colon cancer Neg Hx   . Colon polyps Neg Hx   . Esophageal cancer Neg Hx   . Stomach cancer Neg Hx   . Rectal cancer Neg Hx     PE: BP 128/80   Pulse 90   Ht 5\' 2"  (1.575 m)   Wt 131 lb (59.4 kg)   SpO2 98%   BMI 23.96 kg/m  Wt Readings from Last 3 Encounters:  04/24/18 131 lb (59.4 kg)  03/15/18 129 lb (58.5 kg)  02/13/18 129 lb (58.5 kg)   Constitutional: normal weight, in NAD Eyes: PERRLA, EOMI, no exophthalmos ENT: moist mucous membranes, no thyromegaly, no cervical lymphadenopathy Cardiovascular: tachycardia, RR, No MRG Respiratory: CTA B Gastrointestinal: abdomen soft, NT, ND, BS+ Musculoskeletal: no deformities, strength intact in all 4 Skin: moist, warm, no rashes Neurological: no tremor with outstretched hands, DTR normal in all 4  ASSESSMENT: 1.  Hypothyroidism  2.  Hashimoto thyroiditis  3. Thyroid nodules  4. H/o GDM  PLAN:  1. Patient with longstanding hypothyroidism, diagnosed as Hashimoto's hypothyroidism at last visit.  She continues on levothyroxine therapy, dose decreased at last visit. - latest thyroid labs reviewed with pt >>  normal in 11/2017 Lab Results  Component Value Date   TSH 0.65 12/18/2017  - she continues on LT4 75 mcg daily, dose decreased from 88 mcg daily at last visit - pt feels good on this dose. - we discussed about taking the thyroid hormone every day, with water, >30 minutes before breakfast, separated by >4 hours from acid reflux medications, calcium, iron, multivitamins. Pt. is taking it correctly.  At last visit, she was taking multivitamins in the morning, along with levothyroxine and I advised her to move these at least 4 hours later. - will check thyroid tests today: TSH and fT4 - If labs are abnormal, she will need to return for repeat TFTs in 1.5 months - OTW, I will see her back in a year, with labs at 6 months.  2.  Hashimoto's thyroiditis -diagnosed at last visit based on elevated thyroid antibodies -We discussed that this is an autoimmune disease, which attacks and ultimately destroys the thyroid with subsequent hypothyroidism.  Treatment is limited to levothyroxine, but we can also use selenium to decrease the amount of inflammation  3. Thyroid nodules  -She has no neck compression symptoms -Reviewed her thyroid ultrasound report from 2012 and 2018: She has a history of pseudo-nodules, which are inflammatory areas, most likely in the setting of thyroiditis.  These are usually benign and since they are so small and not worrisome, I would not recommend further thyroid ultrasounds unless she starts complaining of neck compression symptoms.  4. H/o GDM - per her request, will check a HbA1c - reviewed fasting Glu from 08/2017: slightly high, at 103  Needs refills.  Office Visit on 04/24/2018  Component Date Value Ref Range Status  . TSH 04/24/2018 0.49  0.35 - 4.50 uIU/mL  Final  . Free T4 04/24/2018 0.93  0.60 - 1.60 ng/dL Final   Comment: Specimens from patients who are undergoing biotin therapy and /or ingesting biotin supplements may contain high levels of biotin.  The higher  biotin concentration in these specimens interferes with this Free T4 assay.  Specimens that contain high levels  of biotin may cause false high results for this Free T4 assay.  Please interpret results in light of the total clinical presentation of the patient.    . Hgb A1c MFr Bld 04/24/2018 5.5  4.6 - 6.5 % Final   Glycemic Control Guidelines for People with Diabetes:Non Diabetic:  <6%Goal of Therapy: <7%Additional Action Suggested:  >8%    TFTs normal. HbA1c also normal.  Philemon Kingdom, MD PhD [facility]

## 2018-04-25 ENCOUNTER — Telehealth: Payer: Self-pay

## 2018-04-25 NOTE — Telephone Encounter (Signed)
-----   Message from Philemon Kingdom, MD sent at 04/24/2018  1:32 PM EST ----- Lenna Sciara, can you please call pt: The thyroid test and hemoglobin A1c are normal.  I refilled her levothyroxine.

## 2018-04-25 NOTE — Telephone Encounter (Signed)
LMTCB

## 2018-07-15 ENCOUNTER — Ambulatory Visit: Payer: 59 | Admitting: Internal Medicine

## 2018-07-15 ENCOUNTER — Encounter: Payer: Self-pay | Admitting: Internal Medicine

## 2018-07-15 VITALS — BP 108/64 | HR 100 | Temp 98.1°F | Resp 16 | Wt 131.6 lb

## 2018-07-15 DIAGNOSIS — R059 Cough, unspecified: Secondary | ICD-10-CM

## 2018-07-15 DIAGNOSIS — R05 Cough: Secondary | ICD-10-CM

## 2018-07-15 DIAGNOSIS — R0982 Postnasal drip: Secondary | ICD-10-CM

## 2018-07-15 NOTE — Progress Notes (Signed)
Subjective:    Patient ID: Tamara Mccoy, female    DOB: 1965-07-15, 53 y.o.   MRN: [id number]  HPI  Pt presents to the clinic today with c/o a cough. She reports this started 1 month ago. The cough is worse in the morning. It is non productive. She denies runny nose, nasal congestion, ear pain, sore throat or shortness of breath. She denies fever, chills or body aches. She has tried Flonase with some relief. She has had sick contacts.  Review of Systems  Past Medical History:  Diagnosis Date  . Allergy   . Anxiety   . Depression   . History of chicken pox   . Hypothyroidism   . Migraine     Current Outpatient Medications  Medication Sig Dispense Refill  . buPROPion (WELLBUTRIN XL) 150 MG 24 hr tablet TAKE 1 TABLET(150 MG) BY MOUTH DAILY 30 tablet 11  . fluticasone (FLONASE) 50 MCG/ACT nasal spray Place into both nostrils daily.    Marland Kitchen levonorgestrel (MIRENA) 20 MCG/24HR IUD 1 each by Intrauterine route once. Inserted 02/2016    . levothyroxine (SYNTHROID, LEVOTHROID) 75 MCG tablet Take 1 tablet (75 mcg total) by mouth daily before breakfast. 90 tablet 3  . Multiple Vitamin (MULTIVITAMIN) tablet Take 1 tablet by mouth daily.     No current facility-administered medications for this visit.     Allergies  Allergen Reactions  . Contrast Media [Iodinated Diagnostic Agents] Hives  . Erythromycin   . Iohexol      Code: HIVES, Desc: Hives on face post injection of 100cc's Omni., Onset Date: [id number]   . Zithromax [Azithromycin]   . Penicillins Rash    Family History  Problem Relation Age of Onset  . COPD Mother   . Kidney failure Mother        only one working   . Heart Problems Mother        stent   . Uterine cancer Mother   . Heart block Father   . Prostate cancer Father   . Leukemia Maternal Grandmother   . Leukemia Maternal Uncle   . Heart attack Maternal Grandfather   . Heart Problems Paternal Grandmother        bypass  . Prostate cancer Paternal Grandfather    . Colon cancer Neg Hx   . Colon polyps Neg Hx   . Esophageal cancer Neg Hx   . Stomach cancer Neg Hx   . Rectal cancer Neg Hx     Social History   Socioeconomic History  . Marital status: Married    Spouse name: Octavia Bruckner  . Number of children: 3  . Years of education: 35  . Highest education level: Not on file  Occupational History    Employer: [employer]  Social Needs  . Financial resource strain: Not on file  . Food insecurity:    Worry: Not on file    Inability: Not on file  . Transportation needs:    Medical: Not on file    Non-medical: Not on file  Tobacco Use  . Smoking status: Never Smoker  . Smokeless tobacco: Never Used  Substance and Sexual Activity  . Alcohol use: Yes    Comment: occasional  . Drug use: No  . Sexual activity: Not Currently  Lifestyle  . Physical activity:    Days per week: Not on file    Minutes per session: Not on file  . Stress: Not on file  Relationships  . Social connections:  Talks on phone: Not on file    Gets together: Not on file    Attends religious service: Not on file    Active member of club or organization: Not on file    Attends meetings of clubs or organizations: Not on file    Relationship status: Not on file  . Intimate partner violence:    Fear of current or ex partner: Not on file    Emotionally abused: Not on file    Physically abused: Not on file    Forced sexual activity: Not on file  Other Topics Concern  . Not on file  Social History Narrative   Patient lives at home with husband Octavia Bruckner and one son.    Patient has 3 children.    Patient has some college.    Patient works at [employer].      Constitutional: Denies fever, malaise, fatigue, headache or abrupt weight changes.  HEENT: Denies eye pain, eye redness, ear pain, ringing in the ears, wax buildup, runny nose, nasal congestion, bloody nose, or sore throat. Respiratory: Pt reports cough. Denies difficulty breathing, shortness of breath, or sputum  production.   Cardiovascular: Denies chest pain, chest tightness, palpitations or swelling in the hands or feet.   No other specific complaints in a complete review of systems (except as listed in HPI above).     Objective:   Physical Exam   BP 108/64   Pulse 100   Temp 98.1 F (36.7 C) (Oral)   Resp 16   Wt 131 lb 9.6 oz (59.7 kg)   SpO2 97%   BMI 24.07 kg/m  Wt Readings from Last 3 Encounters:  07/15/18 131 lb 9.6 oz (59.7 kg)  04/24/18 131 lb (59.4 kg)  03/15/18 129 lb (58.5 kg)    General: Appears her stated age, well developed, well nourished in NAD. HEENT: Head: normal shape and size, no sinus tenderness noted; Ears: Tm's gray and intact, normal light reflex; Nose: mucosa boggy and moist, turbinates swollen; Throat/Mouth: Teeth present, mucosa pink and moist, + PND, no exudate, lesions or ulcerations noted.  Neck:  No adenopathy noted. Cardiovascular: Normal rate and rhythm. S1,S2 noted.  No murmur, rubs or gallops noted. Pulmonary/Chest: Normal effort and positive vesicular breath sounds. No respiratory distress. No wheezes, rales or ronchi noted.  Neurological: Alert and oriented.   BMET    Component Value Date/Time   NA 139 09/12/2017 0814   K 4.6 09/12/2017 0814   CL 106 09/12/2017 0814   CO2 28 09/12/2017 0814   GLUCOSE 103 (H) 09/12/2017 0814   BUN 17 09/12/2017 0814   CREATININE 0.81 09/12/2017 0814   CALCIUM 9.2 09/12/2017 0814    Lipid Panel     Component Value Date/Time   CHOL 199 08/08/2017 1545   TRIG 76.0 08/08/2017 1545   HDL 79.40 08/08/2017 1545   CHOLHDL 3 08/08/2017 1545   VLDL 15.2 08/08/2017 1545   LDLCALC 105 (H) 08/08/2017 1545    CBC    Component Value Date/Time   WBC 11.0 (H) 08/08/2017 1545   RBC 4.42 08/08/2017 1545   HGB 13.5 08/08/2017 1545   HCT 41.1 08/08/2017 1545   PLT 336.0 08/08/2017 1545   MCV 92.9 08/08/2017 1545   MCHC 32.8 08/08/2017 1545   RDW 13.8 08/08/2017 1545    Hgb A1C Lab Results  Component  Value Date   HGBA1C 5.5 04/24/2018           Assessment & Plan:   Cough secondary  to PND:  Continue Flonase Add in [place] if worse No indication for abx or steroids at this time  Return precautions discussed Webb Silversmith, NP

## 2018-07-15 NOTE — Patient Instructions (Signed)

## 2018-09-04 ENCOUNTER — Other Ambulatory Visit: Payer: Self-pay | Admitting: Internal Medicine

## 2018-09-10 NOTE — Telephone Encounter (Signed)
Pt called to check status. She is out of medication. I set her up for follow up tomorrow 4/22. She said she has been out for 3 days and per pt she does not like being off this medication for 3 days.

## 2018-09-11 ENCOUNTER — Encounter: Payer: Self-pay | Admitting: Internal Medicine

## 2018-09-11 ENCOUNTER — Ambulatory Visit (INDEPENDENT_AMBULATORY_CARE_PROVIDER_SITE_OTHER): Payer: POS | Admitting: Internal Medicine

## 2018-09-11 DIAGNOSIS — E063 Autoimmune thyroiditis: Secondary | ICD-10-CM | POA: Diagnosis not present

## 2018-09-11 DIAGNOSIS — F329 Major depressive disorder, single episode, unspecified: Secondary | ICD-10-CM

## 2018-09-11 DIAGNOSIS — G43C1 Periodic headache syndromes in child or adult, intractable: Secondary | ICD-10-CM | POA: Diagnosis not present

## 2018-09-11 DIAGNOSIS — E038 Other specified hypothyroidism: Secondary | ICD-10-CM | POA: Diagnosis not present

## 2018-09-11 DIAGNOSIS — G43909 Migraine, unspecified, not intractable, without status migrainosus: Secondary | ICD-10-CM | POA: Insufficient documentation

## 2018-09-11 DIAGNOSIS — F419 Anxiety disorder, unspecified: Secondary | ICD-10-CM

## 2018-09-11 NOTE — Assessment & Plan Note (Signed)
Discussed stress relieving techniques Continue Ibuprofen or Excedrin as needed Will monitor

## 2018-09-11 NOTE — Progress Notes (Addendum)
Virtual Visit via Video Note  I connected with Tamara Mccoy on 09/11/18 at  2:15 PM EDT by a video enabled telemedicine application and verified that I am speaking with the correct person using two identifiers.   I discussed the limitations of evaluation and management by telemedicine and the availability of in person appointments. The patient expressed understanding and agreed to proceed.  Patient Location: [employer], [employer]: Office  History of Present Illness:  Pt due for follow up of chronic conditions.  Migraines: Triggered by stress. She reports she has not had a full blown migraine in quite a while. She is able to take Ibuprofen or Excedrin with good relief.  Hypothyroidism: Thyroid levels from 04/2018 reviewed. She denies any issues on her current dose of Levothyroxine. Her last thyroid ultrasound was in 2012. She is following with Dr. Cruzita Lederer.  Anxiety and Depression: Chronic but stable on Wellbutrin. She is not interested in weaning her medication at this time. She is not currently seeing a therapist. She denies SI/HI.    Past Medical History:  Diagnosis Date  . Allergy   . Anxiety   . Depression   . History of chicken pox   . Hypothyroidism   . Migraine     Current Outpatient Medications  Medication Sig Dispense Refill  . buPROPion (WELLBUTRIN XL) 150 MG 24 hr tablet TAKE 1 TABLET(150 MG) BY MOUTH DAILY 30 tablet 11  . fluticasone (FLONASE) 50 MCG/ACT nasal spray Place into both nostrils daily.    Marland Kitchen levonorgestrel (MIRENA) 20 MCG/24HR IUD 1 each by Intrauterine route once. Inserted 02/2016    . levothyroxine (SYNTHROID, LEVOTHROID) 75 MCG tablet Take 1 tablet (75 mcg total) by mouth daily before breakfast. 90 tablet 3  . Multiple Vitamin (MULTIVITAMIN) tablet Take 1 tablet by mouth daily.     No current facility-administered medications for this visit.     Allergies  Allergen Reactions  . Contrast Media [Iodinated Diagnostic Agents] Hives  .  Erythromycin   . Iohexol      Code: HIVES, Desc: Hives on face post injection of 100cc's Omni., Onset Date: [id number]   . Zithromax [Azithromycin]   . Penicillins Rash    Family History  Problem Relation Age of Onset  . COPD Mother   . Kidney failure Mother        only one working   . Heart Problems Mother        stent   . Uterine cancer Mother   . Heart block Father   . Prostate cancer Father   . Leukemia Maternal Grandmother   . Leukemia Maternal Uncle   . Heart attack Maternal Grandfather   . Heart Problems Paternal Grandmother        bypass  . Prostate cancer Paternal Grandfather   . Colon cancer Neg Hx   . Colon polyps Neg Hx   . Esophageal cancer Neg Hx   . Stomach cancer Neg Hx   . Rectal cancer Neg Hx     Social History   Socioeconomic History  . Marital status: Married    Spouse name: Octavia Bruckner  . Number of children: 3  . Years of education: 76  . Highest education level: Not on file  Occupational History    Employer: [employer]  Social Needs  . Financial resource strain: Not on file  . Food insecurity:    Worry: Not on file    Inability: Not on file  . Transportation needs:    Medical: Not  on file    Non-medical: Not on file  Tobacco Use  . Smoking status: Never Smoker  . Smokeless tobacco: Never Used  Substance and Sexual Activity  . Alcohol use: Yes    Comment: occasional  . Drug use: No  . Sexual activity: Not Currently  Lifestyle  . Physical activity:    Days per week: Not on file    Minutes per session: Not on file  . Stress: Not on file  Relationships  . Social connections:    Talks on phone: Not on file    Gets together: Not on file    Attends religious service: Not on file    Active member of club or organization: Not on file    Attends meetings of clubs or organizations: Not on file    Relationship status: Not on file  . Intimate partner violence:    Fear of current or ex partner: Not on file    Emotionally abused: Not on file     Physically abused: Not on file    Forced sexual activity: Not on file  Other Topics Concern  . Not on file  Social History Narrative   Patient lives at home with husband Octavia Bruckner and one son.    Patient has 3 children.    Patient has some college.    Patient works at [employer].      Constitutional: Pt reports intermittent headaches. Denies fever, malaise, fatigue, or abrupt weight changes.  HEENT: Denies eye pain, eye redness, ear pain, ringing in the ears, wax buildup, runny nose, nasal congestion, bloody nose, or sore throat. Respiratory: Denies difficulty breathing, shortness of breath, cough or sputum production.   Cardiovascular: Denies chest pain, chest tightness, palpitations or swelling in the hands or feet.  Gastrointestinal: Denies abdominal pain, bloating, constipation, diarrhea or blood in the stool.  GU: Denies urgency, frequency, pain with urination, burning sensation, blood in urine, odor or discharge. Musculoskeletal: Denies decrease in range of motion, difficulty with gait, muscle pain or joint pain and swelling.  Skin: Denies redness, rashes, lesions or ulcercations.  Neurological: Denies dizziness, difficulty with memory, difficulty with speech or problems with balance and coordination.  Psych: Pt has a history of anxiety and depression. Denies SI/HI.  No other specific complaints in a complete review of systems (except as listed in HPI above).   Wt Readings from Last 3 Encounters:  07/15/18 131 lb 9.6 oz (59.7 kg)  04/24/18 131 lb (59.4 kg)  03/15/18 129 lb (58.5 kg)    General: Appears her stated age, well developed, well nourished in NAD. Pulmonary/Chest: Normal effort. No respiratory distress.  Neurological: Alert and oriented.  Psychiatric: Mood and affect normal. Behavior is normal. Judgment and thought content normal.     BMET    Component Value Date/Time   NA 139 09/12/2017 0814   K 4.6 09/12/2017 0814   CL 106 09/12/2017 0814   CO2 28 09/12/2017 0814    GLUCOSE 103 (H) 09/12/2017 0814   BUN 17 09/12/2017 0814   CREATININE 0.81 09/12/2017 0814   CALCIUM 9.2 09/12/2017 0814    Lipid Panel     Component Value Date/Time   CHOL 199 08/08/2017 1545   TRIG 76.0 08/08/2017 1545   HDL 79.40 08/08/2017 1545   CHOLHDL 3 08/08/2017 1545   VLDL 15.2 08/08/2017 1545   LDLCALC 105 (H) 08/08/2017 1545    CBC    Component Value Date/Time   WBC 11.0 (H) 08/08/2017 1545   RBC 4.42 08/08/2017  1545   HGB 13.5 08/08/2017 1545   HCT 41.1 08/08/2017 1545   PLT 336.0 08/08/2017 1545   MCV 92.9 08/08/2017 1545   MCHC 32.8 08/08/2017 1545   RDW 13.8 08/08/2017 1545    Hgb A1C Lab Results  Component Value Date   HGBA1C 5.5 04/24/2018        Assessment and Plan:  See problem based charting  Follow Up Instructions:    I discussed the assessment and treatment plan with the patient. The patient was provided an opportunity to ask questions and all were answered. The patient agreed with the plan and demonstrated an understanding of the instructions.   The patient was advised to call back or seek an in-person evaluation if the symptoms worsen or if the condition fails to improve as anticipated.     Webb Silversmith, NP

## 2018-09-11 NOTE — Patient Instructions (Signed)

## 2018-09-11 NOTE — Assessment & Plan Note (Signed)
Recent thyroid labs normal Continue current dose of Levothyroxine She will continue to follow with endocrinology

## 2018-09-11 NOTE — Assessment & Plan Note (Signed)
Support offered today Continue Wellbutrin for now Will monitor

## 2018-10-23 ENCOUNTER — Other Ambulatory Visit (INDEPENDENT_AMBULATORY_CARE_PROVIDER_SITE_OTHER): Payer: POS

## 2018-10-23 ENCOUNTER — Other Ambulatory Visit: Payer: Self-pay

## 2018-10-23 DIAGNOSIS — E063 Autoimmune thyroiditis: Secondary | ICD-10-CM

## 2018-10-23 DIAGNOSIS — E038 Other specified hypothyroidism: Secondary | ICD-10-CM | POA: Diagnosis not present

## 2018-10-23 LAB — T4, FREE: Free T4: 1.02 ng/dL (ref 0.60–1.60)

## 2018-10-23 LAB — TSH: TSH: 0.78 u[IU]/mL (ref 0.35–4.50)

## 2018-10-25 ENCOUNTER — Telehealth: Payer: Self-pay

## 2018-10-25 NOTE — Telephone Encounter (Signed)
Left message for patient to return our call at [phone].  

## 2018-10-25 NOTE — Telephone Encounter (Signed)
-----   Message from Philemon Kingdom, MD sent at 10/23/2018  1:17 PM EDT ----- Lenna Sciara, can you please call pt: TFTs are normal.  Please continue current dose of levothyroxine.

## 2018-10-28 NOTE — Telephone Encounter (Signed)
I discussed with her at last visit that no ultrasounds are actually needed for her unless she develops neck compression symptoms.  The guidelines have changed and the small nodules that were noticed on the previous ultrasound are not worrisome.  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  These are usually benign and since they are so small and not worrisome, I would not recommend further thyroid ultrasounds unless she starts complaining of neck compression symptoms.

## 2018-10-28 NOTE — Telephone Encounter (Signed)
Notified patient of message from Dr. Cruzita Lederer, patient expressed understanding and agreement.  Patient also wanted to have her thyroid US ordered, per last visit notes she last had it done June 2018, repeat in 2-3 years.

## 2018-10-31 NOTE — Telephone Encounter (Signed)
Spoke to patient and read below message, she said she has neck and arm pain but decided she will go to her PCP and ask for this to be ordered.

## 2018-11-13 ENCOUNTER — Ambulatory Visit (INDEPENDENT_AMBULATORY_CARE_PROVIDER_SITE_OTHER): Payer: POS | Admitting: Internal Medicine

## 2018-11-13 ENCOUNTER — Encounter: Payer: Self-pay | Admitting: Internal Medicine

## 2018-11-13 DIAGNOSIS — M79601 Pain in right arm: Secondary | ICD-10-CM

## 2018-11-13 DIAGNOSIS — M79602 Pain in left arm: Secondary | ICD-10-CM | POA: Diagnosis not present

## 2018-11-13 DIAGNOSIS — G44201 Tension-type headache, unspecified, intractable: Secondary | ICD-10-CM | POA: Diagnosis not present

## 2018-11-13 DIAGNOSIS — M542 Cervicalgia: Secondary | ICD-10-CM

## 2018-11-13 NOTE — Patient Instructions (Signed)
General Headache Without Cause A headache is pain or discomfort that is felt around the head or neck area. There are many causes and types of headaches. In some cases, the cause may not be found. Follow these instructions at home: Watch your condition for any changes. Let your doctor know about them. Take these steps to help with your condition: Managing pain      Take over-the-counter and prescription medicines only as told by your doctor.  Lie down in a dark, quiet room when you have a headache.  If told, put ice on your head and neck area: ? Put ice in a plastic bag. ? Place a towel between your skin and the bag. ? Leave the ice on for 20 minutes, 2-3 times per day.  If told, put heat on the affected area. Use the heat source that your doctor recommends, such as a moist heat pack or a heating pad. ? Place a towel between your skin and the heat source. ? Leave the heat on for 20-30 minutes. ? Remove the heat if your skin turns bright red. This is very important if you are unable to feel pain, heat, or cold. You may have a greater risk of getting burned.  Keep lights dim if bright lights bother you or make your headaches worse. Eating and drinking  Eat meals on a regular schedule.  If you drink alcohol: ? Limit how much you use to:  0-1 drink a day for women.  0-2 drinks a day for men. ? Be aware of how much alcohol is in your drink. In the [place] one drink equals one 12 oz bottle of beer (355 mL), one 5 oz glass of wine (148 mL), or one 1 oz glass of hard liquor (44 mL).  Stop drinking caffeine, or reduce how much caffeine you drink. General instructions   Keep a journal to find out if certain things bring on headaches. For example, write down: ? What you eat and drink. ? How much sleep you get. ? Any change to your diet or medicines.  Get a massage or try other ways to relax.  Limit stress.  Sit up straight. Do not tighten (tense) your muscles.  Do not use any  products that contain nicotine or tobacco. This includes cigarettes, e-cigarettes, and chewing tobacco. If you need help quitting, ask your doctor.  Exercise regularly as told by your doctor.  Get enough sleep. This often means 7-9 hours of sleep each night.  Keep all follow-up visits as told by your doctor. This is important. Contact a doctor if:  Your symptoms are not helped by medicine.  You have a headache that feels different than the other headaches.  You feel sick to your stomach (nauseous) or you throw up (vomit).  You have a fever. Get help right away if:  Your headache gets very bad quickly.  Your headache gets worse after a lot of physical activity.  You keep throwing up.  You have a stiff neck.  You have trouble seeing.  You have trouble speaking.  You have pain in the eye or ear.  Your muscles are weak or you lose muscle control.  You lose your balance or have trouble walking.  You feel like you will pass out (faint) or you pass out.  You are mixed up (confused).  You have a seizure. Summary  A headache is pain or discomfort that is felt around the head or neck area.  There are many causes and   types of headaches. In some cases, the cause may not be found.  Keep a journal to help find out what causes your headaches. Watch your condition for any changes. Let your doctor know about them.  Contact a doctor if you have a headache that is different from usual, or if your headache is not helped by medicine.  Get help right away if your headache gets very bad, you throw up, you have trouble seeing, you lose your balance, or you have a seizure. This information is not intended to replace advice given to you by your health care provider. Make sure you discuss any questions you have with your health care provider. Document Released: 02/15/2008 Document Revised: 11/26/2017 Document Reviewed: 11/26/2017 Elsevier Interactive Patient Education  2019 Elsevier  Inc.  

## 2018-11-13 NOTE — Progress Notes (Signed)
Virtual Visit via Video Note  I connected with Tamara Mccoy on 11/13/18 at  3:00 PM EDT by a video enabled telemedicine application and verified that I am speaking with the correct person using two identifiers.  Location: Patient: Home Provider: Office   I discussed the limitations of evaluation and management by telemedicine and the availability of in person appointments. The patient expressed understanding and agreed to proceed.  History of Present Illness:  Pt wanting to bilateral arm pain. This started last night. She describes the pain as achy and sore. She reports this morning, she woke up with neck pain and a headache. She describes the neck pain as sore and achy. The headache was located all over her head. She denies dizziness, visual changes, feelings of syncope. She did have some nausea, but no vomiting. She took Ibuprofen and drank some caffeine with complete resolution of symptoms.      Past Medical History:  Diagnosis Date  . Allergy   . Anxiety   . Depression   . History of chicken pox   . Hypothyroidism   . Migraine     Current Outpatient Medications  Medication Sig Dispense Refill  . buPROPion (WELLBUTRIN XL) 150 MG 24 hr tablet TAKE 1 TABLET(150 MG) BY MOUTH DAILY 30 tablet 11  . fluticasone (FLONASE) 50 MCG/ACT nasal spray Place into both nostrils daily.    Marland Kitchen levonorgestrel (MIRENA) 20 MCG/24HR IUD 1 each by Intrauterine route once. Inserted 02/2016    . levothyroxine (SYNTHROID, LEVOTHROID) 75 MCG tablet Take 1 tablet (75 mcg total) by mouth daily before breakfast. 90 tablet 3  . Multiple Vitamin (MULTIVITAMIN) tablet Take 1 tablet by mouth daily.     No current facility-administered medications for this visit.     Allergies  Allergen Reactions  . Contrast Media [Iodinated Diagnostic Agents] Hives  . Erythromycin   . Iohexol      Code: HIVES, Desc: Hives on face post injection of 100cc's Omni., Onset Date: [id number]   . Zithromax [Azithromycin]   .  Penicillins Rash    Family History  Problem Relation Age of Onset  . COPD Mother   . Kidney failure Mother        only one working   . Heart Problems Mother        stent   . Uterine cancer Mother   . Heart block Father   . Prostate cancer Father   . Leukemia Maternal Grandmother   . Leukemia Maternal Uncle   . Heart attack Maternal Grandfather   . Heart Problems Paternal Grandmother        bypass  . Prostate cancer Paternal Grandfather   . Colon cancer Neg Hx   . Colon polyps Neg Hx   . Esophageal cancer Neg Hx   . Stomach cancer Neg Hx   . Rectal cancer Neg Hx     Social History   Socioeconomic History  . Marital status: Married    Spouse name: Octavia Bruckner  . Number of children: 3  . Years of education: 22  . Highest education level: Not on file  Occupational History    Employer: [employer]  Social Needs  . Financial resource strain: Not on file  . Food insecurity    Worry: Not on file    Inability: Not on file  . Transportation needs    Medical: Not on file    Non-medical: Not on file  Tobacco Use  . Smoking status: Never Smoker  . Smokeless  tobacco: Never Used  Substance and Sexual Activity  . Alcohol use: Yes    Comment: occasional  . Drug use: No  . Sexual activity: Not Currently  Lifestyle  . Physical activity    Days per week: Not on file    Minutes per session: Not on file  . Stress: Not on file  Relationships  . Social Herbalist on phone: Not on file    Gets together: Not on file    Attends religious service: Not on file    Active member of club or organization: Not on file    Attends meetings of clubs or organizations: Not on file    Relationship status: Not on file  . Intimate partner violence    Fear of current or ex partner: Not on file    Emotionally abused: Not on file    Physically abused: Not on file    Forced sexual activity: Not on file  Other Topics Concern  . Not on file  Social History Narrative   Patient lives at  home with husband Octavia Bruckner and one son.    Patient has 3 children.    Patient has some college.    Patient works at [employer].      Constitutional: Pt reports headache (resolved) Denies fever, malaise, fatigue, or abrupt weight changes.  HEENT: Denies eye pain, eye redness, ear pain, ringing in the ears, wax buildup, runny nose, nasal congestion, bloody nose, or sore throat. Respiratory: Denies difficulty breathing, shortness of breath, cough or sputum production.   Cardiovascular: Denies chest pain, chest tightness, palpitations or swelling in the hands or feet.  Gastrointestinal: Denies abdominal pain, bloating, constipation, diarrhea or blood in the stool.  GU: Denies urgency, frequency, pain with urination, burning sensation, blood in urine, odor or discharge. Musculoskeletal: Pt reports neck and arm pain (resolved). Denies decrease in range of motion, difficulty with gait,  or joint pain and swelling.  Skin: Denies redness, rashes, lesions or ulcercations.    No other specific complaints in a complete review of systems (except as listed in HPI above).   Wt Readings from Last 3 Encounters:  07/15/18 131 lb 9.6 oz (59.7 kg)  04/24/18 131 lb (59.4 kg)  03/15/18 129 lb (58.5 kg)    General: Appears her stated age, well developed, well nourished in NAD. Skin: Warm, dry and intact. No rashes, lesions or ulcerations noted. HEENT: Head: normal shape and size; Eyes: sclera white, and EOMs intact;  Pulmonary/Chest: Normal effort. No respiratory distress.  Musculoskeletal: Normal flexion, extension and rotation of the cervical spine. Normal internal and external rotation of bilateral shoulder. Neurological: Alert and oriented.    BMET    Component Value Date/Time   NA 139 09/12/2017 0814   K 4.6 09/12/2017 0814   CL 106 09/12/2017 0814   CO2 28 09/12/2017 0814   GLUCOSE 103 (H) 09/12/2017 0814   BUN 17 09/12/2017 0814   CREATININE 0.81 09/12/2017 0814   CALCIUM 9.2 09/12/2017 0814     Lipid Panel     Component Value Date/Time   CHOL 199 08/08/2017 1545   TRIG 76.0 08/08/2017 1545   HDL 79.40 08/08/2017 1545   CHOLHDL 3 08/08/2017 1545   VLDL 15.2 08/08/2017 1545   LDLCALC 105 (H) 08/08/2017 1545    CBC    Component Value Date/Time   WBC 11.0 (H) 08/08/2017 1545   RBC 4.42 08/08/2017 1545   HGB 13.5 08/08/2017 1545   HCT 41.1 08/08/2017 1545  PLT 336.0 08/08/2017 1545   MCV 92.9 08/08/2017 1545   MCHC 32.8 08/08/2017 1545   RDW 13.8 08/08/2017 1545    Hgb A1C Lab Results  Component Value Date   HGBA1C 5.5 04/24/2018         Follow Up Instructions:  Bilateral Arm Pain, Neck Pain, Headache:  Symptoms resolved with Ibuprofen and Caffeine Advised her to monitor symptoms for now Let me know if return   I discussed the assessment and treatment plan with the patient. The patient was provided an opportunity to ask questions and all were answered. The patient agreed with the plan and demonstrated an understanding of the instructions.   The patient was advised to call back or seek an in-person evaluation if the symptoms worsen or if the condition fails to improve as anticipated.     Webb Silversmith, NP

## 2019-02-03 ENCOUNTER — Telehealth: Payer: Self-pay | Admitting: Internal Medicine

## 2019-02-03 NOTE — Telephone Encounter (Signed)
Patient called back today requesting an appointment, This patient is scheduled tomorrow @ 9:00am.  She stated she spoke with a triage nurse [facility]) and they advised her to go directly to the ED. Patient stated she did not feel like she needed to go there because it was not that bad/serious.  Patient stated that during excursion she becomes very weak,sweaty, dizzy and feels like she could pass out. But it only happens during excursion no other time   Went ahead and scheduled her an appointment in our first opening tomorrow. Advised her that if it does get worse she needs to go straight to the ED to be evaluated.

## 2019-02-03 NOTE — Telephone Encounter (Signed)
Agree with that advisement  Shapale-please do EKG at her arrival tomorrow, thanks

## 2019-02-04 ENCOUNTER — Other Ambulatory Visit: Payer: Self-pay

## 2019-02-04 ENCOUNTER — Ambulatory Visit: Payer: POS | Admitting: Family Medicine

## 2019-02-04 ENCOUNTER — Encounter: Payer: Self-pay | Admitting: Family Medicine

## 2019-02-04 VITALS — BP 130/76 | HR 102 | Temp 98.0°F | Ht 62.0 in | Wt 128.1 lb

## 2019-02-04 DIAGNOSIS — E063 Autoimmune thyroiditis: Secondary | ICD-10-CM

## 2019-02-04 DIAGNOSIS — R42 Dizziness and giddiness: Secondary | ICD-10-CM

## 2019-02-04 DIAGNOSIS — E038 Other specified hypothyroidism: Secondary | ICD-10-CM

## 2019-02-04 DIAGNOSIS — Z8632 Personal history of gestational diabetes: Secondary | ICD-10-CM

## 2019-02-04 DIAGNOSIS — R55 Syncope and collapse: Secondary | ICD-10-CM | POA: Insufficient documentation

## 2019-02-04 LAB — COMPREHENSIVE METABOLIC PANEL
ALT: 17 U/L (ref 0–35)
AST: 21 U/L (ref 0–37)
Albumin: 4.4 g/dL (ref 3.5–5.2)
Alkaline Phosphatase: 85 U/L (ref 39–117)
BUN: 14 mg/dL (ref 6–23)
CO2: 30 mEq/L (ref 19–32)
Calcium: 9.7 mg/dL (ref 8.4–10.5)
Chloride: 104 mEq/L (ref 96–112)
Creatinine, Ser: 0.84 mg/dL (ref 0.40–1.20)
GFR: 70.36 mL/min (ref >60.00)
Glucose, Bld: 103 mg/dL — ABNORMAL HIGH (ref 70–99)
Potassium: 4.9 mEq/L (ref 3.5–5.1)
Sodium: 141 mEq/L (ref 135–145)
Total Bilirubin: 0.7 mg/dL (ref 0.2–1.2)
Total Protein: 6.8 g/dL (ref 6.0–8.3)

## 2019-02-04 LAB — CBC WITH DIFFERENTIAL/PLATELET
Basophils Absolute: 0 10*3/uL (ref 0.0–0.1)
Basophils Relative: 0.7 % (ref 0.0–3.0)
Eosinophils Absolute: 0.2 10*3/uL (ref 0.0–0.7)
Eosinophils Relative: 3.4 % (ref 0.0–5.0)
HCT: 42 % (ref 36.0–46.0)
Hemoglobin: 13.8 g/dL (ref 12.0–15.0)
Lymphocytes Relative: 16.3 % (ref 12.0–46.0)
Lymphs Abs: 1 10*3/uL (ref 0.7–4.0)
MCHC: 33 g/dL (ref 30.0–36.0)
MCV: 93.9 fl (ref 78.0–100.0)
Monocytes Absolute: 0.4 10*3/uL (ref 0.1–1.0)
Monocytes Relative: 6.6 % (ref 3.0–12.0)
Neutro Abs: 4.4 10*3/uL (ref 1.4–7.7)
Neutrophils Relative %: 73 % (ref 43.0–77.0)
Platelets: 235 10*3/uL (ref 150.0–400.0)
RBC: 4.47 Mil/uL (ref 3.87–5.11)
RDW: 13.7 % (ref 11.5–15.5)
WBC: 6 10*3/uL (ref 4.0–10.5)

## 2019-02-04 LAB — TSH: TSH: 1.56 u[IU]/mL (ref 0.35–4.50)

## 2019-02-04 LAB — HEMOGLOBIN A1C: Hgb A1c MFr Bld: 5.7 % (ref 4.6–6.5)

## 2019-02-04 NOTE — Progress Notes (Signed)
Subjective:    Patient ID: Tamara Mccoy, female    DOB: [birth year], 53 y.o.   MRN: [id number]  HPI 53 yo pt of NP Baity here for symptoms of light headedness/weakness/sweating   Was in [facility] (Friday)  It was 89 degrees  Drinking lots of water  Did 6.8 mi bike ride - more than usual / all flat - strenuous   Felt very weak and trembly , hot flashes  R hand tingly (from gripping bike handle)  Took 10 minutes for the worst to pass (got better w/o eating anything) Rested in pool -no appetite Ate lunch ? 2 hours later   No cp or sob  Did have rapid HR/palpitations  Had some abd cramping above umbilicus  No vomiting or diarrhea   Had eaten breakfast with protein (eggs/bacon)   Next episode was sat am  After intercourse - felt similar symptoms / shaky/light headed [place]  Same thing  Drank water- lay down - 10 minutes  Quicker recovery that day   Ate breakfast afterwards   Widowed 1.5 y  This is first intimate relationship     Wt Readings from Last 3 Encounters:  02/04/19 128 lb 2 oz (58.1 kg)  07/15/18 131 lb 9.6 oz (59.7 kg)  04/24/18 131 lb (59.4 kg)   23.43 kg/m   BP Readings from Last 3 Encounters:  02/04/19 130/76  07/15/18 108/64  04/24/18 128/80   Pulse Readings from Last 3 Encounters:  02/04/19 (!) 102  07/15/18 100  04/24/18 90    Hypothyroid (hashimotos with thyroid nodules) Lab Results  Component Value Date   TSH 0.78 10/23/2018   Lab Results  Component Value Date   HGBA1C 5.5 04/24/2018  had GDM in the past   Lab Results  Component Value Date   CREATININE 0.81 09/12/2017   BUN 17 09/12/2017   NA 139 09/12/2017   K 4.6 09/12/2017   CL 106 09/12/2017   CO2 28 09/12/2017   Lab Results  Component Value Date   ALT 17 09/12/2017   AST 20 09/12/2017   ALKPHOS 107 09/12/2017   BILITOT 0.3 09/12/2017    Lab Results  Component Value Date   WBC 11.0 (H) 08/08/2017   HGB 13.5 08/08/2017   HCT 41.1 08/08/2017   MCV 92.9  08/08/2017   PLT 336.0 08/08/2017   Going through menopause symptoms   Anxiety/depression  Takes wellbutrin   EKG today : NSR with rate of 90  No acute changes   Patient Active Problem List   Diagnosis Date Noted  . Pre-syncope 02/04/2019  . History of gestational diabetes 02/04/2019  . Migraines 09/11/2018  . Anxiety and depression 06/15/2016  . Hypothyroidism 06/15/2016  . IBS (irritable bowel syndrome) 06/15/2016   Past Medical History:  Diagnosis Date  . Allergy   . Anxiety   . Depression   . History of chicken pox   . Hypothyroidism   . Migraine    Past Surgical History:  Procedure Laterality Date  . COLONOSCOPY    . WISDOM TOOTH EXTRACTION     Social History   Tobacco Use  . Smoking status: Never Smoker  . Smokeless tobacco: Never Used  Substance Use Topics  . Alcohol use: Yes    Comment: occasional  . Drug use: No   Family History  Problem Relation Age of Onset  . COPD Mother   . Kidney failure Mother        only one working   .  Heart Problems Mother        stent   . Uterine cancer Mother   . Heart block Father   . Prostate cancer Father   . Leukemia Maternal Grandmother   . Leukemia Maternal Uncle   . Heart attack Maternal Grandfather   . Heart Problems Paternal Grandmother        bypass  . Prostate cancer Paternal Grandfather   . Colon cancer Neg Hx   . Colon polyps Neg Hx   . Esophageal cancer Neg Hx   . Stomach cancer Neg Hx   . Rectal cancer Neg Hx    Allergies  Allergen Reactions  . Contrast Media [Iodinated Diagnostic Agents] Hives  . Erythromycin   . Iohexol      Code: HIVES, Desc: Hives on face post injection of 100cc's Omni., Onset Date: [id number]   . Zithromax [Azithromycin]   . Penicillins Rash   Current Outpatient Medications on File Prior to Visit  Medication Sig Dispense Refill  . buPROPion (WELLBUTRIN XL) 150 MG 24 hr tablet TAKE 1 TABLET(150 MG) BY MOUTH DAILY 30 tablet 11  . fluticasone (FLONASE) 50 MCG/ACT nasal  spray Place into both nostrils daily.    Marland Kitchen levonorgestrel (MIRENA) 20 MCG/24HR IUD 1 each by Intrauterine route once. Inserted 02/2016    . levothyroxine (SYNTHROID, LEVOTHROID) 75 MCG tablet Take 1 tablet (75 mcg total) by mouth daily before breakfast. 90 tablet 3  . Multiple Vitamin (MULTIVITAMIN) tablet Take 1 tablet by mouth daily.     No current facility-administered medications on file prior to visit.     Review of Systems  Constitutional: Negative for activity change, appetite change, fatigue, fever and unexpected weight change.       Previous symptoms are resolved today No longer dizzy or tired  HENT: Negative for congestion, ear pain, rhinorrhea, sinus pressure and sore throat.   Eyes: Negative for pain, redness and visual disturbance.  Respiratory: Negative for cough, shortness of breath and wheezing.   Cardiovascular: Negative for chest pain and palpitations.  Gastrointestinal: Negative for abdominal pain, blood in stool, constipation and diarrhea.  Endocrine: Negative for polydipsia and polyuria.  Genitourinary: Negative for dysuria, frequency and urgency.  Musculoskeletal: Negative for arthralgias, back pain and myalgias.  Skin: Negative for pallor and rash.  Allergic/Immunologic: Negative for environmental allergies.  Neurological: Negative for dizziness, tremors, seizures, syncope, facial asymmetry, speech difficulty, weakness, light-headedness, numbness and headaches.       Previous symptoms are resolved  Hematological: Negative for adenopathy. Does not bruise/bleed easily.  Psychiatric/Behavioral: Negative for decreased concentration, dysphoric mood and sleep disturbance. The patient is not nervous/anxious.        Objective:   Physical Exam Constitutional:      General: She is not in acute distress.    Appearance: Normal appearance. She is well-developed and normal weight. She is not ill-appearing or diaphoretic.  HENT:     Head: Normocephalic and atraumatic.      Right Ear: Tympanic membrane, ear canal and external ear normal.     Left Ear: Tympanic membrane, ear canal and external ear normal.     Nose: Nose normal.     Mouth/Throat:     Mouth: Mucous membranes are moist.     Pharynx: Oropharynx is clear. No oropharyngeal exudate.  Eyes:     General: No scleral icterus.       Right eye: No discharge.        Left eye: No discharge.     Conjunctiva/sclera:  Conjunctivae normal.     Pupils: Pupils are equal, round, and reactive to light.     Comments: No nystagmus  Neck:     Musculoskeletal: Full passive range of motion without pain, normal range of motion and neck supple. No neck rigidity or muscular tenderness.     Thyroid: No thyromegaly.     Vascular: No carotid bruit or JVD.     Trachea: No tracheal deviation.  Cardiovascular:     Rate and Rhythm: Regular rhythm. Tachycardia present.     Pulses: Normal pulses.     Heart sounds: Normal heart sounds. No murmur.  Pulmonary:     Effort: Pulmonary effort is normal. No respiratory distress.     Breath sounds: Normal breath sounds. No wheezing or rales.  Abdominal:     General: Bowel sounds are normal. There is no distension.     Palpations: Abdomen is soft. There is no mass.     Tenderness: There is no abdominal tenderness.     Hernia: No hernia is present.  Musculoskeletal:        General: No tenderness.  Lymphadenopathy:     Cervical: No cervical adenopathy.  Skin:    General: Skin is warm and dry.     Coloration: Skin is not pale.     Findings: No rash.  Neurological:     Mental Status: She is alert and oriented to person, place, and time.     Cranial Nerves: No cranial nerve deficit, dysarthria or facial asymmetry.     Sensory: No sensory deficit.     Motor: No weakness, tremor, atrophy, abnormal muscle tone, seizure activity or pronator drift.     Coordination: Romberg sign negative. Coordination normal. Finger-Nose-Finger Test normal.     Gait: Gait normal.     Deep Tendon  Reflexes: Reflexes are normal and symmetric.     Comments: No focal cerebellar signs   Psychiatric:        Mood and Affect: Mood is anxious.        Behavior: Behavior normal.        Thought Content: Thought content normal.        Cognition and Memory: Cognition and memory normal.     Comments: Mildly anxious  Pt states this is usually the case when here and HR goes up           Assessment & Plan:   Problem List Items Addressed This Visit      Cardiovascular and Mediastinum   Pre-syncope - Primary    2 episodes following exertion (one when overheated one when not)  Disc differential diag -includes hypoglycemia (prev h/o GDM as well) Asymptomatic today with reassuring ekg  Nl exam and neuro exam Labs today  Low threshold for 3 HGTT Disc plan to eat small frequent meals with protein  Stay hydrated inst to alert [place] if symptoms return or change       Relevant Orders   CBC with Differential/Platelet (Completed)   Comprehensive metabolic panel (Completed)   TSH (Completed)   Hemoglobin A1c (Completed)     Endocrine   Hypothyroidism    TSH today      Relevant Orders   TSH (Completed)     Other   History of gestational diabetes    ? If possibly having hypoglycemia  Lab today  Consider 3H GTT if nl      Relevant Orders   Hemoglobin A1c (Completed)    Other Visit Diagnoses    Dizziness  Relevant Orders   EKG 12-Lead (Completed)   CBC with Differential/Platelet (Completed)   Comprehensive metabolic panel (Completed)   Hemoglobin A1c (Completed)

## 2019-02-04 NOTE — Assessment & Plan Note (Signed)
TSH today 

## 2019-02-04 NOTE — Assessment & Plan Note (Addendum)
2 episodes following exertion (one when overheated one when not)  Disc differential diag -includes hypoglycemia (prev h/o GDM as well) Asymptomatic today with reassuring ekg  Nl exam and neuro exam Labs today  Low threshold for 3 HGTT Disc plan to eat small frequent meals with protein  Stay hydrated inst to alert [place] if symptoms return or change

## 2019-02-04 NOTE — Assessment & Plan Note (Signed)
?   If possibly having hypoglycemia  Lab today  Consider 3H GTT if nl

## 2019-02-04 NOTE — Patient Instructions (Signed)
Try to eat more frequently  Small meals with protein and complex carbs  Try to get most of your carbohydrates from produce (with the exception of white potatoes)  Eat less bread/pasta/rice/snack foods/cereals/sweets and other items from the middle of the grocery store (processed carbs)   Drink lots of fluids Alert [place] if symptoms return

## 2019-02-07 ENCOUNTER — Telehealth: Payer: Self-pay | Admitting: *Deleted

## 2019-02-07 NOTE — Telephone Encounter (Signed)
Addressed through result notes  

## 2019-02-07 NOTE — Telephone Encounter (Signed)
Left VM requesting pt to call the office regarding lab results 

## 2019-02-07 NOTE — Telephone Encounter (Signed)
Patient is calling back in regards to her lab results.

## 2019-02-09 ENCOUNTER — Telehealth: Payer: Self-pay | Admitting: Family Medicine

## 2019-02-09 DIAGNOSIS — R55 Syncope and collapse: Secondary | ICD-10-CM

## 2019-02-09 NOTE — Telephone Encounter (Signed)
-----   Message from Tammi Sou, [place] sent at 02/07/2019  3:47 PM EDT ----- Pt notified of lab results and Dr. Marliss Coots comments. Pt agrees with 3 hr glucose tol. test. I advised pt our pcc will call to schedule test

## 2019-02-10 ENCOUNTER — Other Ambulatory Visit: Payer: Self-pay | Admitting: Internal Medicine

## 2019-02-10 ENCOUNTER — Telehealth: Payer: Self-pay | Admitting: Radiology

## 2019-02-10 DIAGNOSIS — R55 Syncope and collapse: Secondary | ICD-10-CM

## 2019-02-10 NOTE — Telephone Encounter (Signed)
LVM Patient needs to go to the [facility] [address] lab for the GTT, in the basement. Doesn't need an appt, they open at 7:30 am. She needs to be fasting, no water or coffee. She will be there 3-4 hours.

## 2019-02-14 ENCOUNTER — Other Ambulatory Visit: Payer: Self-pay | Admitting: Internal Medicine

## 2019-03-21 ENCOUNTER — Encounter: Payer: Self-pay | Admitting: Gastroenterology

## 2019-04-03 DIAGNOSIS — K5900 Constipation, unspecified: Secondary | ICD-10-CM | POA: Insufficient documentation

## 2019-04-21 ENCOUNTER — Other Ambulatory Visit: Payer: Self-pay

## 2019-04-21 ENCOUNTER — Ambulatory Visit (AMBULATORY_SURGERY_CENTER): Payer: POS | Admitting: *Deleted

## 2019-04-21 VITALS — Temp 97.8°F | Ht 62.0 in | Wt 128.0 lb

## 2019-04-21 DIAGNOSIS — Z1159 Encounter for screening for other viral diseases: Secondary | ICD-10-CM

## 2019-04-21 DIAGNOSIS — Z1211 Encounter for screening for malignant neoplasm of colon: Secondary | ICD-10-CM

## 2019-04-21 MED ORDER — NA SULFATE-K SULFATE-MG SULF 17.5-3.13-1.6 GM/177ML PO SOLN: ORAL | 0 refills | Status: DC

## 2019-04-21 NOTE — Progress Notes (Signed)
Patient is here in-person for PV. Patient denies any allergies to eggs or soy. Patient denies any problems with anesthesia/sedation. Patient denies any oxygen use at home. Patient denies taking any diet/weight loss medications or blood thinners. Patient is not being treated for MRSA or C-diff. EMMI education assisgned to the patient for the procedure, this was explained and instructions given to patient. COVID-19 screening test is on 12/15, the pt is aware. Pt is aware that care partner will wait in the car during procedure; if they feel like they will be too hot or cold to wait in the car; they may wait in the 4 th floor lobby. Patient is aware to bring only one care partner. We want them to wear a mask (we do not have any that we can provide them), practice social distancing, and we will check their temperatures when they get here.  I did remind the patient that their care partner needs to stay in the parking lot the entire time and have a cell phone available, we will call them when the pt is ready for discharge. Patient will wear mask into building.    Suprep $15 off coupon given to the patient.

## 2019-04-22 HISTORY — PX: COLONOSCOPY: SHX174

## 2019-04-23 ENCOUNTER — Encounter: Payer: POS | Admitting: Gastroenterology

## 2019-04-24 ENCOUNTER — Encounter: Payer: Self-pay | Admitting: Gastroenterology

## 2019-05-06 ENCOUNTER — Ambulatory Visit (INDEPENDENT_AMBULATORY_CARE_PROVIDER_SITE_OTHER): Payer: POS

## 2019-05-06 ENCOUNTER — Other Ambulatory Visit: Payer: Self-pay | Admitting: Gastroenterology

## 2019-05-06 DIAGNOSIS — Z1159 Encounter for screening for other viral diseases: Secondary | ICD-10-CM

## 2019-05-07 LAB — SARS CORONAVIRUS 2 (TAT 6-24 HRS): SARS Coronavirus 2: NEGATIVE

## 2019-05-09 ENCOUNTER — Ambulatory Visit (AMBULATORY_SURGERY_CENTER): Payer: POS | Admitting: Gastroenterology

## 2019-05-09 ENCOUNTER — Encounter: Payer: Self-pay | Admitting: Gastroenterology

## 2019-05-09 ENCOUNTER — Other Ambulatory Visit: Payer: Self-pay

## 2019-05-09 VITALS — BP 121/77 | HR 79 | Temp 98.6°F | Resp 17 | Ht 62.0 in | Wt 128.0 lb

## 2019-05-09 DIAGNOSIS — K621 Rectal polyp: Secondary | ICD-10-CM | POA: Diagnosis not present

## 2019-05-09 DIAGNOSIS — D128 Benign neoplasm of rectum: Secondary | ICD-10-CM

## 2019-05-09 DIAGNOSIS — Z1211 Encounter for screening for malignant neoplasm of colon: Secondary | ICD-10-CM

## 2019-05-09 MED ORDER — SODIUM CHLORIDE 0.9 % IV SOLN: 500.0000 mL | Freq: Once | INTRAVENOUS | Status: DC

## 2019-05-09 NOTE — Op Note (Signed)
[facility] Patient Name: Tamara Mccoy Procedure Date: 05/09/2019 2:29 PM MRN: [id number] Endoscopist: Mauri Pole , MD Age: 53 Referring MD:  Date of Birth: [birth year] Gender: Female Account #: [id number] Procedure:                Colonoscopy Indications:              Screening for colorectal malignant neoplasm Medicines:                Monitored Anesthesia Care Procedure:                Pre-Anesthesia Assessment:                           - Prior to the procedure, a History and Physical                            was performed, and patient medications and                            allergies were reviewed. The patient's tolerance of                            previous anesthesia was also reviewed. The risks                            and benefits of the procedure and the sedation                            options and risks were discussed with the patient.                            All questions were answered, and informed consent                            was obtained. Prior Anticoagulants: The patient has                            taken no previous anticoagulant or antiplatelet                            agents. ASA Grade Assessment: II - A patient with                            mild systemic disease. After reviewing the risks                            and benefits, the patient was deemed in                            satisfactory condition to undergo the procedure.                           After obtaining informed consent, the colonoscope  was passed under direct vision. Throughout the                            procedure, the patient's blood pressure, pulse, and                            oxygen saturations were monitored continuously. The                            Colonoscope was introduced through the anus and                            advanced to the the cecum, identified by                            appendiceal orifice  and ileocecal valve. The                            colonoscopy was performed without difficulty. The                            patient tolerated the procedure well. The quality                            of the bowel preparation was excellent. The                            ileocecal valve, appendiceal orifice, and rectum                            were photographed. Scope In: 2:37:38 PM Scope Out: 2:54:49 PM Scope Withdrawal Time: 0 hours 12 minutes 15 seconds  Total Procedure Duration: 0 hours 17 minutes 11 seconds  Findings:                 The perianal and digital rectal examinations were                            normal.                           A less than 1 mm polyp was found in the rectum. The                            polyp was sessile. The polyp was removed with a                            cold biopsy forceps. Resection and retrieval were                            complete.                           Non-bleeding internal hemorrhoids were found during  retroflexion. The hemorrhoids were small.                           The exam was otherwise without abnormality. Complications:            No immediate complications. Estimated Blood Loss:     Estimated blood loss was minimal. Impression:               - One less than 1 mm polyp in the rectum, removed                            with a cold biopsy forceps. Resected and retrieved.                           - Non-bleeding internal hemorrhoids.                           - The examination was otherwise normal. Recommendation:           - Patient has a contact number available for                            emergencies. The signs and symptoms of potential                            delayed complications were discussed with the                            patient. Return to normal activities tomorrow.                            Written discharge instructions were provided to the                             patient.                           - Resume previous diet.                           - Continue present medications.                           - Await pathology results.                           - Repeat colonoscopy in 5-10 years for surveillance                            based on pathology results. Mauri Pole, MD 05/09/2019 2:57:37 PM This report has been signed electronically.

## 2019-05-09 NOTE — Progress Notes (Signed)
Called to room to assist during endoscopic procedure.  Patient ID and intended procedure confirmed with present staff. Received instructions for my participation in the procedure from the performing physician.  

## 2019-05-09 NOTE — Progress Notes (Signed)
PT taken to PACU. Monitors in place. VSS. Report given to RN. 

## 2019-05-09 NOTE — Progress Notes (Signed)
VS-DT Temp-JB   Pt's states no medical or surgical changes since previsit or office visit.  

## 2019-05-09 NOTE — Patient Instructions (Signed)
YOU HAD AN ENDOSCOPIC PROCEDURE TODAY AT THE [facility]:   Refer to the procedure report that was given to you for any specific questions about what was found during the examination.  If the procedure report does not answer your questions, please call your gastroenterologist to clarify.  If you requested that your care partner not be given the details of your procedure findings, then the procedure report has been included in a sealed envelope for you to review at your convenience later.  YOU SHOULD EXPECT: Some feelings of bloating in the abdomen. Passage of more gas than usual.  Walking can help get rid of the air that was put into your GI tract during the procedure and reduce the bloating. If you had a lower endoscopy (such as a colonoscopy or flexible sigmoidoscopy) you may notice spotting of blood in your stool or on the toilet paper. If you underwent a bowel prep for your procedure, you may not have a normal bowel movement for a few days.  Please Note:  You might notice some irritation and congestion in your nose or some drainage.  This is from the oxygen used during your procedure.  There is no need for concern and it should clear up in a day or so.  SYMPTOMS TO REPORT IMMEDIATELY:   Following lower endoscopy (colonoscopy or flexible sigmoidoscopy):  Excessive amounts of blood in the stool  Significant tenderness or worsening of abdominal pains  Swelling of the abdomen that is new, acute  Fever of 100F or higher  For urgent or emergent issues, a gastroenterologist can be reached at any hour by calling [phone].   DIET:  We do recommend a small meal at first, but then you may proceed to your regular diet.  Drink plenty of fluids but you should avoid alcoholic beverages for 24 hours.  ACTIVITY:  You should plan to take it easy for the rest of today and you should NOT DRIVE or use heavy machinery until tomorrow (because of the sedation medicines used during the test).     FOLLOW UP: Our staff will call the number listed on your records 48-72 hours following your procedure to check on you and address any questions or concerns that you may have regarding the information given to you following your procedure. If we do not reach you, we will leave a message.  We will attempt to reach you two times.  During this call, we will ask if you have developed any symptoms of COVID 19. If you develop any symptoms (ie: fever, flu-like symptoms, shortness of breath, cough etc.) before then, please call [phone].  If you test positive for Covid 19 in the 2 weeks post procedure, please call and report this information to us.    If any biopsies were taken you will be contacted by phone or by letter within the next 1-3 weeks.  Please call us at [phone] if you have not heard about the biopsies in 3 weeks.    SIGNATURES/CONFIDENTIALITY: You and/or your care partner have signed paperwork which will be entered into your electronic medical record.  These signatures attest to the fact that that the information above on your After Visit Summary has been reviewed and is understood.  Full responsibility of the confidentiality of this discharge information lies with you and/or your care-partner. 

## 2019-05-13 ENCOUNTER — Telehealth: Payer: Self-pay

## 2019-05-13 NOTE — Telephone Encounter (Signed)
  Follow up Call-  Call back number 05/09/2019  Post procedure Call Back phone  # [phone]  Permission to leave phone message Yes  Some recent data might be hidden     Patient questions:  Do you have a fever, pain , or abdominal swelling? No. Pain Score  0 *  Have you tolerated food without any problems? Yes.    Have you been able to return to your normal activities? Yes.    Do you have any questions about your discharge instructions: Diet   No. Medications  No. Follow up visit  No.  Do you have questions or concerns about your Care? No.  Actions: * If pain score is 4 or above: No action needed, pain <4.  1. Have you developed a fever since your procedure? no  2.   Have you had an respiratory symptoms (SOB or cough) since your procedure? no  3.   Have you tested positive for COVID 19 since your procedure no  4.   Have you had any family members/close contacts diagnosed with the COVID 19 since your procedure?  no   If yes to any of these questions please route to Joylene John, RN and Alphonsa Gin, Therapist, sports.

## 2019-05-19 ENCOUNTER — Encounter: Payer: Self-pay | Admitting: Gastroenterology

## 2019-05-26 ENCOUNTER — Other Ambulatory Visit: Payer: Self-pay

## 2019-05-26 MED ORDER — LEVOTHYROXINE SODIUM 75 MCG PO TABS: ORAL_TABLET | ORAL | 0 refills | Status: DC

## 2019-06-05 ENCOUNTER — Other Ambulatory Visit: Payer: Self-pay

## 2019-06-05 ENCOUNTER — Encounter (HOSPITAL_COMMUNITY): Payer: Self-pay | Admitting: Emergency Medicine

## 2019-06-05 ENCOUNTER — Emergency Department (HOSPITAL_COMMUNITY)
Admission: EM | Admit: 2019-06-05 | Discharge: 2019-06-05 | Disposition: A | Discharge status: Home / Self Care | Payer: POS | Attending: Emergency Medicine | Admitting: Emergency Medicine

## 2019-06-05 ENCOUNTER — Telehealth: Payer: Self-pay

## 2019-06-05 DIAGNOSIS — K047 Periapical abscess without sinus: Secondary | ICD-10-CM | POA: Diagnosis present

## 2019-06-05 DIAGNOSIS — R1033 Periumbilical pain: Secondary | ICD-10-CM

## 2019-06-05 LAB — COMPREHENSIVE METABOLIC PANEL
ALT: 28 U/L (ref 0–44)
AST: 26 U/L (ref 15–41)
Albumin: 4.3 g/dL (ref 3.5–5.0)
Alkaline Phosphatase: 98 U/L (ref 38–126)
Anion gap: 11 (ref 5–15)
BUN: 16 mg/dL (ref 6–20)
CO2: 26 mmol/L (ref 22–32)
Calcium: 9.3 mg/dL (ref 8.9–10.3)
Chloride: 105 mmol/L (ref 98–111)
Creatinine, Ser: 0.94 mg/dL (ref 0.44–1.00)
GFR calc Af Amer: >60 mL/min (ref >60)
GFR calc non Af Amer: >60 mL/min (ref >60)
Glucose, Bld: 111 mg/dL — ABNORMAL HIGH (ref 70–99)
Potassium: 4.6 mmol/L (ref 3.5–5.1)
Sodium: 142 mmol/L (ref 135–145)
Total Bilirubin: 0.7 mg/dL (ref 0.3–1.2)
Total Protein: 7.3 g/dL (ref 6.5–8.1)

## 2019-06-05 LAB — CBC WITH DIFFERENTIAL/PLATELET
Abs Immature Granulocytes: 0.02 10*3/uL (ref 0.00–0.07)
Basophils Absolute: 0 10*3/uL (ref 0.0–0.1)
Basophils Relative: 1 %
Eosinophils Absolute: 0.1 10*3/uL (ref 0.0–0.5)
Eosinophils Relative: 2 %
HCT: 45.8 % (ref 36.0–46.0)
Hemoglobin: 14.4 g/dL (ref 12.0–15.0)
Immature Granulocytes: 0 %
Lymphocytes Relative: 10 %
Lymphs Abs: 0.8 10*3/uL (ref 0.7–4.0)
MCH: 30.9 pg (ref 26.0–34.0)
MCHC: 31.4 g/dL (ref 30.0–36.0)
MCV: 98.3 fL (ref 80.0–100.0)
Monocytes Absolute: 0.5 10*3/uL (ref 0.1–1.0)
Monocytes Relative: 6 %
Neutro Abs: 7 10*3/uL (ref 1.7–7.7)
Neutrophils Relative %: 81 %
Platelets: 251 10*3/uL (ref 150–400)
RBC: 4.66 MIL/uL (ref 3.87–5.11)
RDW: 12.9 % (ref 11.5–15.5)
WBC: 8.5 10*3/uL (ref 4.0–10.5)
nRBC: 0 % (ref 0.0–0.2)

## 2019-06-05 LAB — URINALYSIS, ROUTINE W REFLEX MICROSCOPIC
Bilirubin Urine: NEGATIVE
Glucose, UA: NEGATIVE mg/dL
Ketones, ur: NEGATIVE mg/dL
Nitrite: NEGATIVE
Protein, ur: NEGATIVE mg/dL
Specific Gravity, Urine: 1.018 (ref 1.005–1.030)
WBC, UA: >50 WBC/hpf — ABNORMAL HIGH (ref 0–5)
pH: 6 (ref 5.0–8.0)

## 2019-06-05 LAB — LIPASE, BLOOD: Lipase: 40 U/L (ref 11–51)

## 2019-06-05 MED ORDER — SODIUM CHLORIDE 0.9 % IV BOLUS
500.0000 mL | Freq: Once | INTRAVENOUS | Status: AC
  Administered 2019-06-05: 500 mL via INTRAVENOUS

## 2019-06-05 MED ORDER — SODIUM CHLORIDE 0.9% FLUSH
3.0000 mL | Freq: Once | INTRAVENOUS | Status: AC
  Administered 2019-06-05: 3 mL via INTRAVENOUS

## 2019-06-05 MED ORDER — SIMETHICONE 80 MG PO CHEW: 80.0000 mg | CHEWABLE_TABLET | Freq: Four times a day (QID) | ORAL | 0 refills | Status: DC | PRN

## 2019-06-05 NOTE — Telephone Encounter (Signed)
[facility] - Client Nonclinical Telephone Record [facility]urse Client [facility] Night - Client Client Site [place] Physician Webb Silversmith - NP Contact Type Call Who Is Calling Patient / Member / Family / Caregiver Caller Name Clotiel Perkins Phone Number [phone] Patient Name Tamara Mccoy Patient DOB 06-06-1965 Call Type Message Only Information Provided Reason for Call Request to Schedule Office Appointment Initial Comment Caller states she has extreme belly pain above belly button. Additional Comment Disp. Time Disposition Final User 06/05/2019 7:58:44 AM General Information Provided Yes Artis Flock Call Closed By: Artis Flock Transaction Date/Time: 06/05/2019 7:56:34 AM (ET)

## 2019-06-05 NOTE — Discharge Instructions (Addendum)
You were seen in the ER today for abdominal pain Your labs were overall very reassuring Your white count was normal Your pancreas, kidney, and liver function tests were reassuring.    We are sending you home with a prescription for gas-x- please take this every 6 hours as needed for discomfort.   We have prescribed you new medication(s) today. Discuss the medications prescribed today with your pharmacist as they can have adverse effects and interactions with your other medicines including over the counter and prescribed medications. Seek medical evaluation if you start to experience new or abnormal symptoms after taking one of these medicines, seek care immediately if you start to experience difficulty breathing, feeling of your throat closing, facial swelling, or rash as these could be indications of a more serious allergic reaction  Please follow up with your primary care provider in 1-3 days Return to the ER immediately for new or worsening symptoms including but not limited to worsened pain, vomiting, inability to pass gas/have a bowel movement, blood in your stool, large amounts of diarrhea, new or different pain, or any other concern.

## 2019-06-05 NOTE — Telephone Encounter (Signed)
This morning at 7 AM pt began with extreme abd pain at belly button but that has been continuous,sharp and pain level now is 8. Pt said no fever or N&V and no diarrhea. Pt said she cannot sit in chair it hurts so bad. Pt will go to [facility] ED; pt's daughter is only one to drive pt and she is quarantined with + covid 05/29/19. Pt and daughter live together. Advised pt I can call 911 for pt and pt said no she thinks she can drive herself; advised pt if she is in that much pain should try to get someone else to take her or call 911. Pt voiced understanding.FYI to Avie Echevaria NP.

## 2019-06-05 NOTE — ED Triage Notes (Signed)
C/o sharp umbilical pain since 7am.  Denies nausea, vomiting, and diarrhea.

## 2019-06-05 NOTE — Telephone Encounter (Signed)
Agree wit advice given, could be appendicitis

## 2019-06-05 NOTE — ED Provider Notes (Signed)
[facility] DEPART[facility] Provider Note   CSN: [id number] Arrival date & time: 06/05/19  0850     History Chief Complaint  Patient presents with  . Abdominal Pain   Tamara Mccoy is a 54 y.o. female with a history of hypothyroidism, migraines, anxiety, or depression who presents to the ED with complaints of abdominal pain that began @ 0700 this AM & is improved @ present. Patient states she woke up this AM feeling okay, but when she transitioned out of bed she developed fairly quick onset non-radiating pain to the periumbilical area, pain constant since onset, worse with seated position, improved with passage of gas en route to the ED & after what she describes as a normal BM in the ER. She states pain was initially a 10/10 in severity, currently more of a 5/10 in severity with palpation otherwise not much pain. Denies fever, chills, nausea, vomiting, diarrhea, melena, hematochezia, dysuria, vaginal bleeding, or vaginal discharge. No prior abdominal surgeries. No suspicion PO intake.   HPI     Past Medical History:  Diagnosis Date  . Allergy   . Anxiety   . Depression   . Female bladder prolapse   . History of chicken pox   . Hypothyroidism   . Migraine     Patient Active Problem List   Diagnosis Date Noted  . Constipation 04/03/2019  . Pre-syncope 02/04/2019  . History of gestational diabetes 02/04/2019  . Migraines 09/11/2018  . Ear itch 09/24/2017  . Anxiety and depression 06/15/2016  . Hypothyroidism 06/15/2016  . IBS (irritable bowel syndrome) 06/15/2016  . Bilateral carpal tunnel syndrome 11/27/2015  . Dermatitis of left ear canal 11/27/2015  . Intercostal myalgia 11/27/2015  . Acquired hypothyroidism 10/27/2015  . Anxiety 07/02/2015  . AR (allergic rhinitis) 07/02/2015  . 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 COLONOSCOPY  09/25/2007  . WISDOM TOOTH EXTRACTION       OB  History   No obstetric history on file.     Family History  Problem Relation Age of Onset  . COPD Mother   . Kidney failure Mother        only one working   . Heart Problems Mother        stent   . Uterine cancer Mother   . Heart block Father   . Prostate cancer Father   . Leukemia Maternal Grandmother   . Leukemia Maternal Uncle   . Heart attack Maternal Grandfather   . Heart Problems Paternal Grandmother        bypass  . Prostate cancer Paternal Grandfather   . Colon cancer Neg Hx   . Colon polyps Neg Hx   . Esophageal cancer Neg Hx   . Stomach cancer Neg Hx   . Rectal cancer Neg Hx     Social History   Tobacco Use  . Smoking status: Never Smoker  . Smokeless tobacco: Never Used  Substance Use Topics  . Alcohol use: Yes    Comment: occasional  . Drug use: No    Home Medications Prior to Admission medications   Medication Sig Start Date End Date Taking? Authorizing Provider  buPROPion (WELLBUTRIN XL) 150 MG 24 hr tablet TAKE 1 TABLET(150 MG) BY MOUTH DAILY 09/10/18   Jearld Fenton, NP  FOLIC ACID PO Take by mouth.    [provider]  levonorgestrel (MIRENA) 20 MCG/24HR IUD 1 each by Intrauterine route once. Inserted  02/2016    [provider]  levothyroxine (SYNTHROID) 75 MCG tablet TAKE 1 TABLET(75 MCG) BY MOUTH DAILY BEFORE BREAKFAST MUST HAVE APPOINTMENT FOR REFILLS 05/26/19   Philemon Kingdom, MD  Multiple Vitamin (MULTIVITAMIN) tablet Take 1 tablet by mouth daily.    [provider]  SELENIUM PO Take by mouth.    [provider]  vitamin B-12 (CYANOCOBALAMIN) 100 MCG tablet Take 100 mcg by mouth daily.    [provider]    Allergies    Contrast media [iodinated diagnostic agents], Erythromycin, Iohexol, Zithromax [azithromycin], and Penicillins  Review of Systems   Review of Systems  Constitutional: Negative for chills and fever.  Respiratory: Negative for cough and shortness of breath.   Cardiovascular:  Negative for chest pain.  Gastrointestinal: Positive for abdominal pain. Negative for blood in stool, constipation, diarrhea, nausea and vomiting.  Genitourinary: Negative for dysuria, vaginal bleeding and vaginal discharge.  Neurological: Negative for syncope.  All other systems reviewed and are negative.   Physical Exam Updated Vital Signs BP (!) 152/102 (BP Location: Left Arm)   Pulse 90   Temp 98 F (36.7 C) (Oral)   Resp 16   SpO2 98%   Physical Exam Vitals and nursing note reviewed.  Constitutional:      General: She is not in acute distress.    Appearance: She is well-developed. She is not toxic-appearing.  HENT:     Head: Normocephalic and atraumatic.  Eyes:     General:        Right eye: No discharge.        Left eye: No discharge.     Conjunctiva/sclera: Conjunctivae normal.  Cardiovascular:     Rate and Rhythm: Normal rate and regular rhythm.  Pulmonary:     Effort: Pulmonary effort is normal. No respiratory distress.     Breath sounds: Normal breath sounds. No wheezing, rhonchi or rales.  Abdominal:     General: There is no distension.     Palpations: Abdomen is soft.     Tenderness: There is abdominal tenderness in the periumbilical area. There is no guarding or rebound.     Comments: Question small periumbilical defect, nothing that would be consistent with strangulated or incarcerated hernia.   Musculoskeletal:     Cervical back: Neck supple.  Skin:    General: Skin is warm and dry.     Findings: No rash.  Neurological:     Mental Status: She is alert.     Comments: Clear speech.   Psychiatric:        Behavior: Behavior normal.     ED Results / Procedures / Treatments   Labs (all labs ordered are listed, but only abnormal results are displayed) Labs Reviewed  COMPREHENSIVE METABOLIC PANEL - Abnormal; Notable for the following components:      Result Value   Glucose, Bld 111 (*)    All other components within normal limits  URINALYSIS, ROUTINE  W REFLEX MICROSCOPIC - Abnormal; Notable for the following components:   APPearance HAZY (*)    Hgb urine dipstick SMALL (*)    Leukocytes,Ua LARGE (*)    WBC, UA >50 (*)    Bacteria, UA FEW (*)    All other components within normal limits  LIPASE, BLOOD  CBC WITH DIFFERENTIAL/PLATELET    EKG None  Radiology No results found.  Procedures Procedures (including critical care time)  Medications Ordered in ED Medications  sodium chloride flush (NS) 0.9 % injection 3 mL (has  no administration in time range)    ED Course  I have reviewed the triage vital signs and the nursing notes.  Pertinent labs & imaging results that were available during my care of the patient were reviewed by me and considered in my medical decision making (see chart for details).    MDM Rules/Calculators/A&P                      Patient presents to the ED with complaints of abdominal pain. Patient nontoxic appearing, in no apparent distress, vitals without significant abnormality. On exam patient tender to the periumbilical region, question small abdominal wall defect @ umbilicus but no findings of incarceration/strangulation, no peritoneal signs. Will evaluate with labs. Plan for fluids & reassessment.  ER work-up reviewed:  CBC: No leukocytosis/anemia.  CMP: No significant electrolyte derangement. Renal function & LFTs WNL Lipase: WNL UA: Leukocytes, but contaminated without urinary sxs- doubt UTI  Patient feeling improved in the ED, on repeat abdominal exam patient remains with some mild tenderness to periumbilical region but also remains without peritoneal signs, Shared decision making conversation had regarding discharge home given improvement in pain vs. CT imaging, patient in agreement with discharge without imaging. Given no peritoneal signs, improvement in pain s/p flatulence & BM in the ED, no leukocytosis, doubt cholecystitis, pancreatitis, diverticulitis, appendicitis, or bowel  obstruction/perforation.  Patient tolerating PO in the emergency department. Will discharge home with supportive measures. I discussed results, treatment plan, need for PCP follow-up, and return precautions with the patient. Provided opportunity for questions, patient confirmed understanding and is in agreement with plan.   Findings and plan of care discussed with supervising physician Dr. Sedonia Small who has evaluated the patient & is in agreement.    Final Clinical Impression(s) / ED Diagnoses Final diagnoses:  Periumbilical abdominal pain    Rx / DC Orders ED Discharge Orders         Ordered    simethicone (GAS-X) 80 MG chewable tablet  Every 6 hours PRN     06/05/19 1012           Vedika Dumlao, [place], PA-C 06/05/19 1102    Maudie Flakes, MD 06/09/19 1515

## 2019-06-06 ENCOUNTER — Telehealth: Payer: Self-pay | Admitting: *Deleted

## 2019-06-06 NOTE — Telephone Encounter (Signed)
Looks like she may have a UTI. Was this a clean catch? They did not send urine culture. Would recommend ER followup on Monday to go over ER visit, repeat urinalysis and send culture.

## 2019-06-06 NOTE — Telephone Encounter (Signed)
Patient called stating that she was seen in the ER yesterday for abdominal pain. Patient stated that she has seen in mychart her urine results. Patient stated that she was not treated for any urinary problems and has concerns about the abnormal readings. Patient stated that she would like for her PCP to review the results and get back to her about them. Pharmacy [facility]

## 2019-06-09 ENCOUNTER — Other Ambulatory Visit: Payer: Self-pay

## 2019-06-10 ENCOUNTER — Ambulatory Visit: Payer: 59 | Admitting: Obstetrics & Gynecology

## 2019-06-10 ENCOUNTER — Encounter: Payer: Self-pay | Admitting: Obstetrics & Gynecology

## 2019-06-10 VITALS — BP 140/92 | Ht 61.75 in | Wt 127.0 lb

## 2019-06-10 DIAGNOSIS — Z719 Counseling, unspecified: Secondary | ICD-10-CM | POA: Diagnosis not present

## 2019-06-10 DIAGNOSIS — N813 Complete uterovaginal prolapse: Secondary | ICD-10-CM | POA: Diagnosis not present

## 2019-06-10 NOTE — Progress Notes (Signed)
    Tamara Mccoy 05-14-1966 [id number]   History:    54 y.o. G3P3L3 Widowed.  Boyfriend lives in [place].  RP:  New patient presenting for symptomatic Uterine Prolapse and Cystocele  HPI: Very symptomatic uterine prolapse and bladder descent progressing over a few years. Evaluated and managed by Dr. Valentino Saxon at [facility] in the last few months. Pelvic ultrasound was normal. Attempted pessaries, but uncomfortable with rectal pressure. No significant stress urinary incontinence. Able to empty her bladder. Patient is active sexually and very active. Prefers a surgical correction.  Past medical history,surgical history, family history and social history were all reviewed and documented in the EPIC chart.  Gynecologic History No LMP recorded. (Menstrual status: IUD).  Obstetric History OB History  Gravida Para Term Preterm AB Living  3 3       3   SAB TAB Ectopic Multiple Live Births               # Outcome Date GA Lbr Len/2nd Weight Sex Delivery Anes PTL Lv  3 Para           2 Para           1 Para              ROS: A ROS was performed and pertinent positives and negatives are included in the history.  GENERAL: No fevers or chills. HEENT: No change in vision, no earache, sore throat or sinus congestion. NECK: No pain or stiffness. CARDIOVASCULAR: No chest pain or pressure. No palpitations. PULMONARY: No shortness of breath, cough or wheeze. GASTROINTESTINAL: No abdominal pain, nausea, vomiting or diarrhea, melena or bright red blood per rectum. GENITOURINARY: No urinary frequency, urgency, hesitancy or dysuria. MUSCULOSKELETAL: No joint or muscle pain, no back pain, no recent trauma. DERMATOLOGIC: No rash, no itching, no lesions. ENDOCRINE: No polyuria, polydipsia, no heat or cold intolerance. No recent change in weight. HEMATOLOGICAL: No anemia or easy bruising or bleeding. NEUROLOGIC: No headache, seizures, numbness, tingling or weakness. PSYCHIATRIC: No depression, no loss of  interest in normal activity or change in sleep pattern.     Exam:   BP (!) 140/92   Ht 5' 1.75" (1.568 m)   Wt 127 lb (57.6 kg)   BMI 23.42 kg/m   Body mass index is 23.42 kg/m.  General appearance : Well developed well nourished female. No acute distress  Pelvic: Vulva: Normal             Vagina: No gross lesions or discharge.  Cystocele grade 4/4.  Cervix: No gross lesions or discharge  Uterus  AV, normal size, shape and consistency, non-tender and mobile.  Complete Uterine prolapse.  Adnexa  Without masses or tenderness  Anus: Normal   Assessment/Plan:  54 y.o. female for annual exam   1. Complete uterine prolapse with prolapse of anterior vaginal wall Symptomatic complete uterine prolapse with cystocele grade 4/4.  No rectocele.  Failed at time to manage with pessary.  All possible surgical management approaches reviewed with patient.  Decision to proceed with an XI robotic supracervical hysterectomy with bilateral salpingectomy and sacrocolpopexy, possible anterior repair.  Information and pamphlet given to patient.  We will follow-up for preop visit.  Would like to proceed in August 2021.  [name] MD, 3:52 PM 06/10/2019

## 2019-06-11 NOTE — Telephone Encounter (Signed)
Patient left another message about her ER visit and the urine results. Patient requested a call back.

## 2019-06-11 NOTE — Telephone Encounter (Signed)
Pt has appt scheduled for 06/12/2019 at 9am, will let me know if this time does not work

## 2019-06-12 ENCOUNTER — Encounter: Payer: Self-pay | Admitting: Internal Medicine

## 2019-06-12 ENCOUNTER — Ambulatory Visit: Payer: POS | Admitting: Internal Medicine

## 2019-06-12 ENCOUNTER — Other Ambulatory Visit: Payer: Self-pay

## 2019-06-12 VITALS — BP 128/78 | HR 83 | Temp 97.5°F | Wt 128.0 lb

## 2019-06-12 DIAGNOSIS — R300 Dysuria: Secondary | ICD-10-CM | POA: Diagnosis not present

## 2019-06-12 DIAGNOSIS — R1033 Periumbilical pain: Secondary | ICD-10-CM

## 2019-06-12 LAB — POC URINALSYSI DIPSTICK (AUTOMATED)
Bilirubin, UA: NEGATIVE
Glucose, UA: NEGATIVE
Ketones, UA: NEGATIVE
Leukocytes, UA: NEGATIVE
Nitrite, UA: NEGATIVE
Protein, UA: NEGATIVE
Spec Grav, UA: 1.025 (ref 1.010–1.025)
Urobilinogen, UA: 0.2 E.U./dL
pH, UA: 6 (ref 5.0–8.0)

## 2019-06-12 NOTE — Progress Notes (Signed)
Subjective:    Patient ID: Tamara Mccoy, female    DOB: Sep 18, 1965, 54 y.o.   MRN: [id number]  HPI  Pt presents to the clinic today for ER followup. She went to the ER 06/05/19 days ago with c/o of sharp periumbilical pain that had been constant since she woke up that morning. The pain improved after passing gas and having a BM. Labs were unremarkable.  Urinalysis was concerning for contamination. She was discharged with a RX for Simethicone which has provided relief. Since discharge, her abdominal pain has lessened but is still slightly tender. She is concerned about the findings in the urinalysis and would like to repeat that today. She denies fever, nausea, vomiting, diarrhea, or constipation. She has not taken anything additional OTC for her symptoms.  Review of Systems      Past Medical History:  Diagnosis Date  . Allergy   . Anxiety   . Depression   . Female bladder prolapse   . History of chicken pox   . Hypothyroidism   . Migraine     Current Outpatient Medications  Medication Sig Dispense Refill  . buPROPion (WELLBUTRIN XL) 150 MG 24 hr tablet TAKE 1 TABLET(150 MG) BY MOUTH DAILY 30 tablet 11  . FOLIC ACID PO Take by mouth.    . levonorgestrel (MIRENA) 20 MCG/24HR IUD 1 each by Intrauterine route once. Inserted 02/2016    . levothyroxine (SYNTHROID) 75 MCG tablet TAKE 1 TABLET(75 MCG) BY MOUTH DAILY BEFORE BREAKFAST MUST HAVE APPOINTMENT FOR REFILLS 90 tablet 0  . Multiple Vitamin (MULTIVITAMIN) tablet Take 1 tablet by mouth daily.    . SELENIUM PO Take by mouth.    . simethicone (GAS-X) 80 MG chewable tablet Chew 1 tablet (80 mg total) by mouth every 6 (six) hours as needed for flatulence. 15 tablet 0  . vitamin B-12 (CYANOCOBALAMIN) 100 MCG tablet Take 100 mcg by mouth daily.     No current facility-administered medications for this visit.    Allergies  Allergen Reactions  . Contrast Media [Iodinated Diagnostic Agents] Hives  . Erythromycin Other (See  Comments)    unknown  . Iohexol      Code: HIVES, Desc: Hives on face post injection of 100cc's Omni., Onset Date: [id number]   . Zithromax [Azithromycin] Other (See Comments)    Heart race  . Penicillins Rash    Family History  Problem Relation Age of Onset  . COPD Mother   . Kidney failure Mother        only one working   . Heart Problems Mother        stent   . Uterine cancer Mother   . Heart block Father   . Prostate cancer Father   . Leukemia Maternal Grandmother   . Leukemia Maternal Uncle   . Heart attack Maternal Grandfather   . Heart Problems Paternal Grandmother        bypass  . Prostate cancer Paternal Grandfather   . Colon cancer Neg Hx   . Colon polyps Neg Hx   . Esophageal cancer Neg Hx   . Stomach cancer Neg Hx   . Rectal cancer Neg Hx     Social History   Socioeconomic History  . Marital status: Married    Spouse name: Octavia Bruckner  . Number of children: 3  . Years of education: 40  . Highest education level: Not on file  Occupational History    Employer: [employer]  Tobacco Use  . Smoking  status: Never Smoker  . Smokeless tobacco: Never Used  Substance and Sexual Activity  . Alcohol use: Yes    Comment: occasional  . Drug use: No  . Sexual activity: Yes    Partners: Male    Comment: 11st intercourse- 75, partners- 4, current partner- 6 mo  Other Topics Concern  . Not on file  Social History Narrative   Patient lives at home with husband Octavia Bruckner and one son.    Patient has 3 children.    Patient has some college.    Patient works at [employer].    Social Determinants of Health   Financial Resource Strain:   . Difficulty of Paying Living Expenses: Not on file  Food Insecurity:   . Worried About Charity fundraiser in the Last Year: Not on file  . Ran Out of Food in the Last Year: Not on file  Transportation Needs:   . Lack of Transportation (Medical): Not on file  . Lack of Transportation (Non-Medical): Not on file  Physical Activity:   . Days of  Exercise per Week: Not on file  . Minutes of Exercise per Session: Not on file  Stress:   . Feeling of Stress : Not on file  Social Connections:   . Frequency of Communication with Friends and Family: Not on file  . Frequency of Social Gatherings with Friends and Family: Not on file  . Attends Religious Services: Not on file  . Active Member of Clubs or Organizations: Not on file  . Attends Archivist Meetings: Not on file  . Marital Status: Not on file  Intimate Partner Violence:   . Fear of Current or Ex-Partner: Not on file  . Emotionally Abused: Not on file  . Physically Abused: Not on file  . Sexually Abused: Not on file     Constitutional: Denies fever, malaise, fatigue, headache or abrupt weight changes.  Respiratory: Denies difficulty breathing, shortness of breath, cough or sputum production.   Cardiovascular: Denies chest pain, chest tightness, palpitations or swelling in the hands or feet.  Gastrointestinal: Pt reports periumbilical abdominal pain. Denies bloating, constipation, diarrhea or blood in the stool.  GU: Denies urgency, frequency, pain with urination, burning sensation, blood in urine, odor or discharge.   No other specific complaints in a complete review of systems (except as listed in HPI above).  Objective:   Physical Exam   BP 128/78   Pulse 83   Temp (!) 97.5 F (36.4 C) (Temporal)   Wt 128 lb (58.1 kg)   SpO2 98%   BMI 23.60 kg/m   Wt Readings from Last 3 Encounters:  06/10/19 127 lb (57.6 kg)  05/09/19 128 lb (58.1 kg)  04/21/19 128 lb (58.1 kg)    General: Appears her stated age, well developed, well nourished in NAD. Cardiovascular: Normal rate and rhythm. S1,S2 noted.  No murmur, rubs or gallops noted.  Pulmonary/Chest: Normal effort and positive vesicular breath sounds. No respiratory distress. No wheezes, rales or ronchi noted.  Abdomen: Soft. Slightly tender in periumbilical region. Normal bowel sounds. No distention or  masses noted. Liver, spleen and kidneys non palpable. Neurological: Alert and oriented. Coordination normal.    BMET    Component Value Date/Time   NA 142 06/05/2019 0921   K 4.6 06/05/2019 0921   CL 105 06/05/2019 0921   CO2 26 06/05/2019 0921   GLUCOSE 111 (H) 06/05/2019 0921   BUN 16 06/05/2019 0921   CREATININE 0.94 06/05/2019 [id number]  CALCIUM 9.3 06/05/2019 0921   GFRNONAA >60 06/05/2019 0921   GFRAA >60 06/05/2019 0921    Lipid Panel     Component Value Date/Time   CHOL 199 08/08/2017 1545   TRIG 76.0 08/08/2017 1545   HDL 79.40 08/08/2017 1545   CHOLHDL 3 08/08/2017 1545   VLDL 15.2 08/08/2017 1545   LDLCALC 105 (H) 08/08/2017 1545    CBC    Component Value Date/Time   WBC 8.5 06/05/2019 0922   RBC 4.66 06/05/2019 0922   HGB 14.4 06/05/2019 0922   HCT 45.8 06/05/2019 0922   PLT 251 06/05/2019 0922   MCV 98.3 06/05/2019 0922   MCH 30.9 06/05/2019 0922   MCHC 31.4 06/05/2019 0922   RDW 12.9 06/05/2019 0922   LYMPHSABS 0.8 06/05/2019 0922   MONOABS 0.5 06/05/2019 0922   EOSABS 0.1 06/05/2019 0922   BASOSABS 0.0 06/05/2019 0922    Hgb A1C Lab Results  Component Value Date   HGBA1C 5.7 02/04/2019           Assessment & Plan:   ER Followup for Periumbilical Abdominal Pain:  ER notes and labs reviewed Urinalysis shows trace blood Will send urine culture Push fluids Continue Simethicone as prescribed by ER  Will follow up after urine culture, return precautions discussed Webb Silversmith, NP This visit occurred during the SARS-CoV-2 public health emergency.  Safety protocols were in place, including screening questions prior to the visit, additional usage of staff PPE, and extensive cleaning of exam room while observing appropriate contact time as indicated for disinfecting solutions.

## 2019-06-13 LAB — URINE CULTURE
MICRO NUMBER:: 10066497
Result:: NO GROWTH
SPECIMEN QUALITY:: ADEQUATE

## 2019-06-15 ENCOUNTER — Encounter: Payer: Self-pay | Admitting: Obstetrics & Gynecology

## 2019-06-15 ENCOUNTER — Encounter: Payer: Self-pay | Admitting: Internal Medicine

## 2019-06-15 NOTE — Patient Instructions (Signed)
Abdominal Bloating When you have abdominal bloating, your abdomen may feel full, tight, or painful. It may also look bigger than normal or swollen (distended). Common causes of abdominal bloating include:  Swallowing air.  Constipation.  Problems digesting food.  Eating too much.  Irritable bowel syndrome. This is a condition that affects the large intestine.  Lactose intolerance. This is an inability to digest lactose, a natural sugar in dairy products.  Celiac disease. This is a condition that affects the ability to digest gluten, a protein found in some grains.  Gastroparesis. This is a condition that slows down the movement of food in the stomach and small intestine. It is more common in people with diabetes mellitus.  Gastroesophageal reflux disease (GERD). This is a digestive condition that makes stomach acid flow back into the esophagus.  Urinary retention. This means that the body is holding onto urine, and the bladder cannot be emptied all the way. Follow these instructions at home: Eating and drinking  Avoid eating too much.  Try not to swallow air while talking or eating.  Avoid eating while lying down.  Avoid these foods and drinks: ? Foods that cause gas, such as broccoli, cabbage, cauliflower, and baked beans. ? Carbonated drinks. ? Hard candy. ? Chewing gum. Medicines  Take over-the-counter and prescription medicines only as told by your health care provider.  Take probiotic medicines. These medicines contain live bacteria or yeasts that can help digestion.  Take coated peppermint oil capsules. Activity  Try to exercise regularly. Exercise may help to relieve bloating that is caused by gas and relieve constipation. General instructions  Keep all follow-up visits as told by your health care provider. This is important. Contact a health care provider if:  You have nausea and vomiting.  You have diarrhea.  You have abdominal pain.  You have unusual  weight loss or weight gain.  You have severe pain, and medicines do not help. Get help right away if:  You have severe chest pain.  You have trouble breathing.  You have shortness of breath.  You have trouble urinating.  You have darker urine than normal.  You have blood in your stools or have dark, tarry stools. Summary  Abdominal bloating means that the abdomen is swollen.  Common causes of abdominal bloating are swallowing air, constipation, and problems digesting food.  Avoid eating too much and avoid swallowing air.  Avoid foods that cause gas, carbonated drinks, hard candy, and chewing gum. This information is not intended to replace advice given to you by your health care provider. Make sure you discuss any questions you have with your health care provider. Document Revised: 08/26/2018 Document Reviewed: 06/09/2016 Elsevier Patient Education  2020 Elsevier Inc.  

## 2019-06-15 NOTE — Patient Instructions (Signed)
1. Complete uterine prolapse with prolapse of anterior vaginal wall Symptomatic complete uterine prolapse with cystocele grade 4/4.  No rectocele.  Failed at time to manage with pessary.  All possible surgical management approaches reviewed with patient.  Decision to proceed with an XI robotic supracervical hysterectomy with bilateral salpingectomy and sacrocolpopexy, possible anterior repair.  Information and pamphlet given to patient.  We will follow-up for preop visit.  Would like to proceed in August 2021.  Smya, it was a pleasure meeting you today!

## 2019-07-16 ENCOUNTER — Ambulatory Visit: Payer: POS | Admitting: Internal Medicine

## 2019-07-16 ENCOUNTER — Encounter: Payer: Self-pay | Admitting: Internal Medicine

## 2019-07-16 ENCOUNTER — Other Ambulatory Visit: Payer: Self-pay

## 2019-07-16 VITALS — BP 130/84 | HR 85 | Temp 97.8°F | Wt 130.0 lb

## 2019-07-16 DIAGNOSIS — K581 Irritable bowel syndrome with constipation: Secondary | ICD-10-CM

## 2019-07-16 DIAGNOSIS — R110 Nausea: Secondary | ICD-10-CM | POA: Diagnosis not present

## 2019-07-16 DIAGNOSIS — R1013 Epigastric pain: Secondary | ICD-10-CM | POA: Diagnosis not present

## 2019-07-16 DIAGNOSIS — K5900 Constipation, unspecified: Secondary | ICD-10-CM

## 2019-07-16 DIAGNOSIS — R140 Abdominal distension (gaseous): Secondary | ICD-10-CM

## 2019-07-16 MED ORDER — OMEPRAZOLE 20 MG PO CPDR: 20.0000 mg | DELAYED_RELEASE_CAPSULE | Freq: Every day | ORAL | 0 refills | Status: DC

## 2019-07-16 NOTE — Progress Notes (Signed)
Subjective:    Patient ID: Tamara Mccoy, female    DOB: February 05, 1966, 54 y.o.   MRN: [id number]  HPI  Pt presents to the clinic today with c/o epigastric burning, nausea, abdominal bloating, gassiness and constipation. This started 2 months ago after her colonoscopy. She reports the epigastric burning, intermittent nausea, is often relieved by eating. She reports bloating and gassiness are relived by GasX OTC. She reports her bowel movements are normally hard to begin with, followed by normal BM. She has not noticed any blood in her stool. She has a BM every other day. She denies vomiting or diarrhea. She denies history of reflux. She can not identify any foods that trigger these issues. She has not tried any acid reducers or similar medication OTC. She has a history of IBS.  Review of Systems      Past Medical History:  Diagnosis Date  . Allergy   . Anxiety   . Depression   . Female bladder prolapse   . History of chicken pox   . Hypothyroidism   . Migraine     Current Outpatient Medications  Medication Sig Dispense Refill  . buPROPion (WELLBUTRIN XL) 150 MG 24 hr tablet TAKE 1 TABLET(150 MG) BY MOUTH DAILY 30 tablet 11  . FOLIC ACID PO Take by mouth.    . levonorgestrel (MIRENA) 20 MCG/24HR IUD 1 each by Intrauterine route once. Inserted 02/2016    . levothyroxine (SYNTHROID) 75 MCG tablet TAKE 1 TABLET(75 MCG) BY MOUTH DAILY BEFORE BREAKFAST MUST HAVE APPOINTMENT FOR REFILLS 90 tablet 0  . Multiple Vitamin (MULTIVITAMIN) tablet Take 1 tablet by mouth daily.    . SELENIUM PO Take by mouth.    . simethicone (GAS-X) 80 MG chewable tablet Chew 1 tablet (80 mg total) by mouth every 6 (six) hours as needed for flatulence. 15 tablet 0  . vitamin B-12 (CYANOCOBALAMIN) 100 MCG tablet Take 100 mcg by mouth daily.     No current facility-administered medications for this visit.    Allergies  Allergen Reactions  . Contrast Media [Iodinated Diagnostic Agents] Hives  . Erythromycin  Other (See Comments)    unknown  . Iohexol      Code: HIVES, Desc: Hives on face post injection of 100cc's Omni., Onset Date: [id number]   . Zithromax [Azithromycin] Other (See Comments)    Heart race  . Penicillins Rash    Family History  Problem Relation Age of Onset  . COPD Mother   . Kidney failure Mother        only one working   . Heart Problems Mother        stent   . Uterine cancer Mother   . Heart block Father   . Prostate cancer Father   . Leukemia Maternal Grandmother   . Leukemia Maternal Uncle   . Heart attack Maternal Grandfather   . Heart Problems Paternal Grandmother        bypass  . Prostate cancer Paternal Grandfather   . Colon cancer Neg Hx   . Colon polyps Neg Hx   . Esophageal cancer Neg Hx   . Stomach cancer Neg Hx   . Rectal cancer Neg Hx     Social History   Socioeconomic History  . Marital status: Married    Spouse name: Octavia Bruckner  . Number of children: 3  . Years of education: 51  . Highest education level: Not on file  Occupational History    Employer: [employer]  Tobacco  Use  . Smoking status: Never Smoker  . Smokeless tobacco: Never Used  Substance and Sexual Activity  . Alcohol use: Yes    Comment: occasional  . Drug use: No  . Sexual activity: Yes    Partners: Male    Comment: 11st intercourse- 82, partners- 4, current partner- 6 mo  Other Topics Concern  . Not on file  Social History Narrative   Patient lives at home with husband Octavia Bruckner and one son.    Patient has 3 children.    Patient has some college.    Patient works at [employer].    Social Determinants of Health   Financial Resource Strain:   . Difficulty of Paying Living Expenses: Not on file  Food Insecurity:   . Worried About Charity fundraiser in the Last Year: Not on file  . Ran Out of Food in the Last Year: Not on file  Transportation Needs:   . Lack of Transportation (Medical): Not on file  . Lack of Transportation (Non-Medical): Not on file  Physical Activity:     . Days of Exercise per Week: Not on file  . Minutes of Exercise per Session: Not on file  Stress:   . Feeling of Stress : Not on file  Social Connections:   . Frequency of Communication with Friends and Family: Not on file  . Frequency of Social Gatherings with Friends and Family: Not on file  . Attends Religious Services: Not on file  . Active Member of Clubs or Organizations: Not on file  . Attends Archivist Meetings: Not on file  . Marital Status: Not on file  Intimate Partner Violence:   . Fear of Current or Ex-Partner: Not on file  . Emotionally Abused: Not on file  . Physically Abused: Not on file  . Sexually Abused: Not on file     Constitutional: Denies fever, malaise, fatigue, headache or abrupt weight changes.  Respiratory: Denies difficulty breathing, shortness of breath, cough or sputum production.   Cardiovascular: Denies chest pain, chest tightness, palpitations or swelling in the hands or feet.  Gastrointestinal: Pt reports epigastric burning, nausea, abdominal bloating, gassiness and constipation. Denies diarrhea or blood in the stool.  GU: Denies urgency, frequency, pain with urination, burning sensation, blood in urine, odor or discharge.  No other specific complaints in a complete review of systems (except as listed in HPI above).  Objective:   Physical Exam  BP 130/84   Pulse 85   Temp 97.8 F (36.6 C) (Temporal)   Wt 130 lb (59 kg)   SpO2 98%   BMI 23.97 kg/m   Wt Readings from Last 3 Encounters:  06/12/19 128 lb (58.1 kg)  06/10/19 127 lb (57.6 kg)  05/09/19 128 lb (58.1 kg)    General: Appears her stated age, well developed, well nourished in NAD. Cardiovascular: Normal rate and rhythm. S1,S2 noted.  No murmur, rubs or gallops noted.  Pulmonary/Chest: Normal effort and positive vesicular breath sounds. No respiratory distress.  Abdomen: Soft and mildly tender in the epigastric region. Normal bowel sounds. No distention or masses  noted. Neurological: Alert and oriented. :  BMET    Component Value Date/Time   NA 142 06/05/2019 0921   K 4.6 06/05/2019 0921   CL 105 06/05/2019 0921   CO2 26 06/05/2019 0921   GLUCOSE 111 (H) 06/05/2019 0921   BUN 16 06/05/2019 0921   CREATININE 0.94 06/05/2019 0921   CALCIUM 9.3 06/05/2019 0921   GFRNONAA >60  06/05/2019 0921   GFRAA >60 06/05/2019 0921    Lipid Panel     Component Value Date/Time   CHOL 199 08/08/2017 1545   TRIG 76.0 08/08/2017 1545   HDL 79.40 08/08/2017 1545   CHOLHDL 3 08/08/2017 1545   VLDL 15.2 08/08/2017 1545   LDLCALC 105 (H) 08/08/2017 1545    CBC    Component Value Date/Time   WBC 8.5 06/05/2019 0922   RBC 4.66 06/05/2019 0922   HGB 14.4 06/05/2019 0922   HCT 45.8 06/05/2019 0922   PLT 251 06/05/2019 0922   MCV 98.3 06/05/2019 0922   MCH 30.9 06/05/2019 0922   MCHC 31.4 06/05/2019 0922   RDW 12.9 06/05/2019 0922   LYMPHSABS 0.8 06/05/2019 0922   MONOABS 0.5 06/05/2019 0922   EOSABS 0.1 06/05/2019 0922   BASOSABS 0.0 06/05/2019 0922    Hgb A1C Lab Results  Component Value Date   HGBA1C 5.7 02/04/2019           Assessment & Plan:   Epigastric Burning, Nausea, Abdominal Bloating, Gassiness, Constipation, IBS:  Exam benign Will check CMET, Amylase, Lipase and H Pylori today RX for Omeprazole 20 mg PO daily 30 min before breakfast Continue GasX as needed  Return precautions discussed Webb Silversmith, NP This visit occurred during the SARS-CoV-2 public health emergency.  Safety protocols were in place, including screening questions prior to the visit, additional usage of staff PPE, and extensive cleaning of exam room while observing appropriate contact time as indicated for disinfecting solutions.

## 2019-07-16 NOTE — Patient Instructions (Signed)
Abdominal Bloating When you have abdominal bloating, your abdomen may feel full, tight, or painful. It may also look bigger than normal or swollen (distended). Common causes of abdominal bloating include:  Swallowing air.  Constipation.  Problems digesting food.  Eating too much.  Irritable bowel syndrome. This is a condition that affects the large intestine.  Lactose intolerance. This is an inability to digest lactose, a natural sugar in dairy products.  Celiac disease. This is a condition that affects the ability to digest gluten, a protein found in some grains.  Gastroparesis. This is a condition that slows down the movement of food in the stomach and small intestine. It is more common in people with diabetes mellitus.  Gastroesophageal reflux disease (GERD). This is a digestive condition that makes stomach acid flow back into the esophagus.  Urinary retention. This means that the body is holding onto urine, and the bladder cannot be emptied all the way. Follow these instructions at home: Eating and drinking  Avoid eating too much.  Try not to swallow air while talking or eating.  Avoid eating while lying down.  Avoid these foods and drinks: ? Foods that cause gas, such as broccoli, cabbage, cauliflower, and baked beans. ? Carbonated drinks. ? Hard candy. ? Chewing gum. Medicines  Take over-the-counter and prescription medicines only as told by your health care provider.  Take probiotic medicines. These medicines contain live bacteria or yeasts that can help digestion.  Take coated peppermint oil capsules. Activity  Try to exercise regularly. Exercise may help to relieve bloating that is caused by gas and relieve constipation. General instructions  Keep all follow-up visits as told by your health care provider. This is important. Contact a health care provider if:  You have nausea and vomiting.  You have diarrhea.  You have abdominal pain.  You have unusual  weight loss or weight gain.  You have severe pain, and medicines do not help. Get help right away if:  You have severe chest pain.  You have trouble breathing.  You have shortness of breath.  You have trouble urinating.  You have darker urine than normal.  You have blood in your stools or have dark, tarry stools. Summary  Abdominal bloating means that the abdomen is swollen.  Common causes of abdominal bloating are swallowing air, constipation, and problems digesting food.  Avoid eating too much and avoid swallowing air.  Avoid foods that cause gas, carbonated drinks, hard candy, and chewing gum. This information is not intended to replace advice given to you by your health care provider. Make sure you discuss any questions you have with your health care provider. Document Revised: 08/26/2018 Document Reviewed: 06/09/2016 Elsevier Patient Education  2020 Elsevier Inc.  

## 2019-07-17 LAB — COMPREHENSIVE METABOLIC PANEL
ALT: 25 U/L (ref 0–35)
AST: 22 U/L (ref 0–37)
Albumin: 4.4 g/dL (ref 3.5–5.2)
Alkaline Phosphatase: 102 U/L (ref 39–117)
BUN: 16 mg/dL (ref 6–23)
CO2: 32 mEq/L (ref 19–32)
Calcium: 10.5 mg/dL (ref 8.4–10.5)
Chloride: 101 mEq/L (ref 96–112)
Creatinine, Ser: 1.01 mg/dL (ref 0.40–1.20)
GFR: 56.79 mL/min — ABNORMAL LOW (ref >60.00)
Glucose, Bld: 79 mg/dL (ref 70–99)
Potassium: 4.8 mEq/L (ref 3.5–5.1)
Sodium: 140 mEq/L (ref 135–145)
Total Bilirubin: 0.6 mg/dL (ref 0.2–1.2)
Total Protein: 7.4 g/dL (ref 6.0–8.3)

## 2019-07-17 LAB — H. PYLORI ANTIBODY, IGG: H Pylori IgG: NEGATIVE

## 2019-07-17 LAB — AMYLASE: Amylase: 56 U/L (ref 27–131)

## 2019-07-17 LAB — LIPASE: Lipase: 39 U/L (ref 11.0–59.0)

## 2019-08-01 ENCOUNTER — Ambulatory Visit: Payer: POS | Admitting: Internal Medicine

## 2019-08-01 ENCOUNTER — Other Ambulatory Visit: Payer: Self-pay

## 2019-08-01 ENCOUNTER — Encounter: Payer: Self-pay | Admitting: Internal Medicine

## 2019-08-01 ENCOUNTER — Ambulatory Visit: Payer: POS | Attending: Internal Medicine

## 2019-08-01 VITALS — BP 126/80 | HR 83 | Temp 97.5°F | Wt 130.0 lb

## 2019-08-01 DIAGNOSIS — Z23 Encounter for immunization: Secondary | ICD-10-CM

## 2019-08-01 DIAGNOSIS — G5701 Lesion of sciatic nerve, right lower limb: Secondary | ICD-10-CM | POA: Diagnosis not present

## 2019-08-01 NOTE — Progress Notes (Signed)
   Covid-19 Vaccination Clinic  Name:  Tamara Mccoy    MRN: [id number] DOB: 01-22-1966  08/01/2019  [name] was observed post Covid-19 immunization for 15 minutes without incident. She was provided with Vaccine Information Sheet and instruction to access the V-Safe system.   [name] was instructed to call 911 with any severe reactions post vaccine: Marland Kitchen Difficulty breathing  . Swelling of face and throat  . A fast heartbeat  . A bad rash all over body  . Dizziness and weakness   Immunizations Administered    Name Date Dose VIS Date Route   Pfizer COVID-19 Vaccine 08/01/2019 -- -- --   Pfizer COVID-19 Vaccine 08/01/2019  4:37 PM 0.3 mL 05/02/2019 Intramuscular   Manufacturer: [place]   [place]ot: [id number]   [place]: [id number]

## 2019-08-01 NOTE — Progress Notes (Signed)
Subjective:    Patient ID: Tamara Mccoy, female    DOB: [birth year], 54 y.o.   MRN: [id number]  HPI  Pt presents to the clinic today with c/o pain in her right buttock. This started 2 weeks ago. She describes the pain as sore. It radiates into the back of her hamstring. She denies any injury to the area but reports she felt a pull in the area while she was bending over at work. Pain worsens after sitting for long periods of time. She denies numbness or tingling but has felt some weakness in the right leg. She has tried Naproxen and stretching with some relief.   Review of Systems  Past Medical History:  Diagnosis Date  . Allergy   . Anxiety   . Depression   . Female bladder prolapse   . History of chicken pox   . Hypothyroidism   . Migraine     Current Outpatient Medications  Medication Sig Dispense Refill  . buPROPion (WELLBUTRIN XL) 150 MG 24 hr tablet TAKE 1 TABLET(150 MG) BY MOUTH DAILY 30 tablet 11  . FOLIC ACID PO Take by mouth.    . levonorgestrel (MIRENA) 20 MCG/24HR IUD 1 each by Intrauterine route once. Inserted 02/2016    . levothyroxine (SYNTHROID) 75 MCG tablet TAKE 1 TABLET(75 MCG) BY MOUTH DAILY BEFORE BREAKFAST MUST HAVE APPOINTMENT FOR REFILLS 90 tablet 0  . Multiple Vitamin (MULTIVITAMIN) tablet Take 1 tablet by mouth daily.    Marland Kitchen omeprazole (PRILOSEC) 20 MG capsule Take 1 capsule (20 mg total) by mouth daily. 30 capsule 0  . SELENIUM PO Take by mouth.    . simethicone (GAS-X) 80 MG chewable tablet Chew 1 tablet (80 mg total) by mouth every 6 (six) hours as needed for flatulence. 15 tablet 0  . vitamin B-12 (CYANOCOBALAMIN) 100 MCG tablet Take 100 mcg by mouth daily.     No current facility-administered medications for this visit.    Allergies  Allergen Reactions  . Contrast Media [Iodinated Diagnostic Agents] Hives  . Erythromycin Other (See Comments)    unknown  . Iohexol      Code: HIVES, Desc: Hives on face post injection of 100cc's Omni., Onset  Date: [id number]   . Zithromax [Azithromycin] Other (See Comments)    Heart race  . Penicillins Rash    Family History  Problem Relation Age of Onset  . COPD Mother   . Kidney failure Mother        only one working   . Heart Problems Mother        stent   . Uterine cancer Mother   . Heart block Father   . Prostate cancer Father   . Leukemia Maternal Grandmother   . Leukemia Maternal Uncle   . Heart attack Maternal Grandfather   . Heart Problems Paternal Grandmother        bypass  . Prostate cancer Paternal Grandfather   . Colon cancer Neg Hx   . Colon polyps Neg Hx   . Esophageal cancer Neg Hx   . Stomach cancer Neg Hx   . Rectal cancer Neg Hx     Social History   Socioeconomic History  . Marital status: Married    Spouse name: Octavia Bruckner  . Number of children: 3  . Years of education: 15  . Highest education level: Not on file  Occupational History    Employer: [employer]  Tobacco Use  . Smoking status: Never Smoker  . Smokeless tobacco:  Never Used  Substance and Sexual Activity  . Alcohol use: Yes    Comment: occasional  . Drug use: No  . Sexual activity: Yes    Partners: Male    Comment: 11st intercourse- 10, partners- 4, current partner- 6 mo  Other Topics Concern  . Not on file  Social History Narrative   Patient lives at home with husband Octavia Bruckner and one son.    Patient has 3 children.    Patient has some college.    Patient works at [employer].    Social Determinants of Health   Financial Resource Strain:   . Difficulty of Paying Living Expenses:   Food Insecurity:   . Worried About Charity fundraiser in the Last Year:   . Arboriculturist in the Last Year:   Transportation Needs:   . Film/video editor (Medical):   Marland Kitchen Lack of Transportation (Non-Medical):   Physical Activity:   . Days of Exercise per Week:   . Minutes of Exercise per Session:   Stress:   . Feeling of Stress :   Social Connections:   . Frequency of Communication with Friends and  Family:   . Frequency of Social Gatherings with Friends and Family:   . Attends Religious Services:   . Active Member of Clubs or Organizations:   . Attends Archivist Meetings:   Marland Kitchen Marital Status:   Intimate Partner Violence:   . Fear of Current or Ex-Partner:   . Emotionally Abused:   Marland Kitchen Physically Abused:   . Sexually Abused:      Constitutional: Denies fever, malaise, fatigue, headache or abrupt weight changes.  Respiratory: Denies difficulty breathing, shortness of breath, cough or sputum production.   Cardiovascular: Denies chest pain, chest tightness, palpitations or swelling in the hands or feet.  Gastrointestinal: Denies abdominal pain, bloating, constipation, diarrhea or blood in the stool.  GU: Denies urgency, frequency, pain with urination, burning sensation, blood in urine, odor or discharge. Musculoskeletal: Pt reports pulled muscle in right buttock. Denies decrease in range of motion, difficulty with gait, or joint pain and swelling.  Neurological: Denies numbness, tingling or problems with balance and coordination.    No other specific complaints in a complete review of systems (except as listed in HPI above).     Objective:   Physical Exam  BP 126/80   Pulse 83   Temp (!) 97.5 F (36.4 C) (Temporal)   Wt 130 lb (59 kg)   SpO2 98%   BMI 23.97 kg/m   Wt Readings from Last 3 Encounters:  07/16/19 130 lb (59 kg)  06/12/19 128 lb (58.1 kg)  06/10/19 127 lb (57.6 kg)    General: Appears her stated age, well developed, well nourished in NAD. Musculoskeletal: Normal range of motion of the spine, but pain with flexion. No difficulty with gait. Neurological: Alert and oriented.    BMET    Component Value Date/Time   NA 140 07/16/2019 1435   K 4.8 07/16/2019 1435   CL 101 07/16/2019 1435   CO2 32 07/16/2019 1435   GLUCOSE 79 07/16/2019 1435   BUN 16 07/16/2019 1435   CREATININE 1.01 07/16/2019 1435   CALCIUM 10.5 07/16/2019 1435   GFRNONAA  >60 06/05/2019 0921   GFRAA >60 06/05/2019 0921    Lipid Panel     Component Value Date/Time   CHOL 199 08/08/2017 1545   TRIG 76.0 08/08/2017 1545   HDL 79.40 08/08/2017 1545   CHOLHDL 3 08/08/2017  1545   VLDL 15.2 08/08/2017 1545   LDLCALC 105 (H) 08/08/2017 1545    CBC    Component Value Date/Time   WBC 8.5 06/05/2019 0922   RBC 4.66 06/05/2019 0922   HGB 14.4 06/05/2019 0922   HCT 45.8 06/05/2019 0922   PLT 251 06/05/2019 0922   MCV 98.3 06/05/2019 0922   MCH 30.9 06/05/2019 0922   MCHC 31.4 06/05/2019 0922   RDW 12.9 06/05/2019 0922   LYMPHSABS 0.8 06/05/2019 0922   MONOABS 0.5 06/05/2019 0922   EOSABS 0.1 06/05/2019 0922   BASOSABS 0.0 06/05/2019 0922    Hgb A1C Lab Results  Component Value Date   HGBA1C 5.7 02/04/2019           Assessment & Plan:   Piriformis Syndrome, Right:  She has Naproxen 500 mg at home, encouraged her to take this 2 x day for 1 week Stretching exercises given If symptoms persist, consider referral to sports medicine for injection  Return precautions discussed  Webb Silversmith, NP This visit occurred during the SARS-CoV-2 public health emergency.  Safety protocols were in place, including screening questions prior to the visit, additional usage of staff PPE, and extensive cleaning of exam room while observing appropriate contact time as indicated for disinfecting solutions.

## 2019-08-01 NOTE — Patient Instructions (Signed)
Piriformis Syndrome Rehab Ask your health care provider which exercises are safe for you. Do exercises exactly as told by your health care provider and adjust them as directed. It is normal to feel mild stretching, pulling, tightness, or discomfort as you do these exercises. Stop right away if you feel sudden pain or your pain gets worse. Do not begin these exercises until told by your health care provider. Stretching and range-of-motion exercises These exercises warm up your muscles and joints and improve the movement and flexibility of your hip and pelvis. The exercises also help to relieve pain, numbness, and tingling. Hip rotation This is an exercise in which you lie on your back and stretch the muscles that rotate your hip (hip rotators) to stretch your buttocks. 1. Lie on your back on a firm surface. 2. Pull your left / right knee toward your same shoulder with your left / right hand until your knee is pointing toward the ceiling. Hold your left / right ankle with your other hand. 3. Keeping your knee steady, gently pull your left / right ankle toward your other shoulder until you feel a stretch in your buttocks. 4. Hold this position for __________ seconds. Repeat __________ times. Complete this exercise __________ times a day. Hip extensor This is an exercise in which you lie on your back and pull your knee to your chest. 1. Lie on your back on a firm surface. Both of your legs should be straight. 2. Pull your left / right knee to your chest. Hold your leg in this position by holding onto the back of your thigh or the front of your knee. 3. Hold this position for __________ seconds. 4. Slowly return to the starting position. Repeat __________ times. Complete this exercise __________ times a day. Strengthening exercises These exercises build strength and endurance in your hip and thigh muscles. Endurance is the ability to use your muscles for a long time, even after they get  tired. Straight leg raises, side-lying This exercise strengthens the muscles that rotate the leg at the hip and move it away from your body (hip abductors). 1. Lie on your side with your left / right leg in the top position. Lie so your head, shoulder, knee, and hip line up. Bend your bottom knee to help you balance. 2. Lift your top leg 4-6 inches (10-15 cm) while keeping your toes pointed straight ahead. 3. Hold this position for __________ seconds. 4. Slowly lower your leg to the starting position. 5. Let your muscles relax completely after each repetition. Repeat __________ times. Complete this exercise __________ times a day. Hip abduction and rotation This is sometimes called quadruped (on hands and knees) exercises. 1. Get on your hands and knees on a firm, lightly padded surface. Your hands should be directly below your shoulders, and your knees should be directly below your hips. 2. Lift your left / right knee out to the side. Keep your knee bent. Do not twist your body. 3. Hold this position for __________ seconds. 4. Slowly lower your leg. Repeat __________ times. Complete this exercise __________ times a day. Straight leg raises, face-down This exercise stretches the muscles that move your hips away from the front of the pelvis (hip extensors). 1. Lie on your abdomen on a bed or a firm surface with a pillow under your hips. 2. Squeeze your buttocks muscles and lift your left / right leg about 4-6 inches (10-15 cm) off the bed. Do not let your back arch. 3. Hold  this position for __________ seconds. 4. Slowly lower your leg to the starting position. 5. Let your muscles relax completely after each repetition. Repeat __________ times. Complete this exercise __________ times a day. This information is not intended to replace advice given to you by your health care provider. Make sure you discuss any questions you have with your health care provider. Document Revised: 08/29/2018  Document Reviewed: 02/28/2018 Elsevier Patient Education  2020 Elsevier Inc.  

## 2019-08-18 ENCOUNTER — Ambulatory Visit: Payer: POS

## 2019-08-19 ENCOUNTER — Ambulatory Visit: Payer: POS | Attending: Internal Medicine

## 2019-08-19 ENCOUNTER — Other Ambulatory Visit: Payer: Self-pay | Admitting: Internal Medicine

## 2019-08-19 DIAGNOSIS — Z23 Encounter for immunization: Secondary | ICD-10-CM

## 2019-08-19 NOTE — Progress Notes (Signed)
   Covid-19 Vaccination Clinic  Name:  Tamara Mccoy    MRN: [id number] DOB: 1966-02-14  08/19/2019  [name] was observed post Covid-19 immunization for 15 minutes without incident. She was provided with Vaccine Information Sheet and instruction to access the V-Safe system.   [name] was instructed to call 911 with any severe reactions post vaccine: Marland Kitchen Difficulty breathing  . Swelling of face and throat  . A fast heartbeat  . A bad rash all over body  . Dizziness and weakness   Immunizations Administered    Name Date Dose VIS Date Route   Pfizer COVID-19 Vaccine 08/19/2019  8:53 AM 0.3 mL 05/02/2019 Intramuscular   Manufacturer: [place]   Lot: [id number]   [place]: [id number]

## 2019-08-21 ENCOUNTER — Encounter: Payer: Self-pay | Admitting: Family Medicine

## 2019-08-21 ENCOUNTER — Ambulatory Visit: Payer: POS | Admitting: Family Medicine

## 2019-08-21 ENCOUNTER — Other Ambulatory Visit: Payer: Self-pay

## 2019-08-21 VITALS — BP 112/80 | HR 104 | Temp 98.4°F | Resp 18 | Ht 62.0 in | Wt 128.0 lb

## 2019-08-21 DIAGNOSIS — M549 Dorsalgia, unspecified: Secondary | ICD-10-CM | POA: Diagnosis not present

## 2019-08-21 NOTE — Progress Notes (Signed)
Subjective:     Tamara Mccoy is a 54 y.o. female presenting for Back Pain (x 2 weeks. Pain located in the upper mid back and radiates to the left side of the back and to the front off and on. Some leg weakness. )     Back Pain This is a new problem. The current episode started 1 to 4 weeks ago. Episode frequency: just comes on as the day progresses. The pain is present in the thoracic spine. Quality: sharp. Radiates to: the front. The pain is moderate. The pain is worse during the day. Exacerbated by: picking up packages from customers. Associated symptoms include tingling. Pertinent negatives include no bladder incontinence, bowel incontinence, chest pain, fever, headaches, numbness or weakness. She has tried NSAIDs for the symptoms. The treatment provided moderate relief.   Second covid shot on Monday - was feeling fatigued  Family hx of abdominal aortic aneurysm   Review of Systems  Constitutional: Negative for fever.  Cardiovascular: Negative for chest pain.  Gastrointestinal: Negative for bowel incontinence.  Genitourinary: Negative for bladder incontinence.  Musculoskeletal: Positive for back pain.  Neurological: Positive for tingling. Negative for weakness, numbness and headaches.     Social History   Tobacco Use  Smoking Status Never Smoker  Smokeless Tobacco Never Used        Objective:    BP Readings from Last 3 Encounters:  08/21/19 112/80  08/01/19 126/80  07/16/19 130/84   Wt Readings from Last 3 Encounters:  08/21/19 128 lb (58.1 kg)  08/01/19 130 lb (59 kg)  07/16/19 130 lb (59 kg)    BP 112/80   Pulse (!) 104   Temp 98.4 F (36.9 C)   Resp 18   Ht 5\' 2"  (1.575 m)   Wt 128 lb (58.1 kg)   SpO2 100%   BMI 23.41 kg/m    Physical Exam Constitutional:      General: She is not in acute distress.    Appearance: She is well-developed. She is not diaphoretic.  HENT:     Right Ear: External ear normal.     Left Ear: External ear normal.       Nose: Nose normal.  Eyes:     Conjunctiva/sclera: Conjunctivae normal.  Cardiovascular:     Rate and Rhythm: Normal rate.  Pulmonary:     Effort: Pulmonary effort is normal.  Musculoskeletal:     Cervical back: Neck supple.     Comments: Back:  Inspection: no abnormalities Palpation: no TTP  ROM: normal ROM w/o pain Strength: upper body strength grossly normal  Skin:    General: Skin is warm and dry.     Capillary Refill: Capillary refill takes less than 2 seconds.  Neurological:     Mental Status: She is alert. Mental status is at baseline.  Psychiatric:        Mood and Affect: Mood normal.        Behavior: Behavior normal.           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 No pain on exam. Discussed early referral to PT if not improving. Reassurance that less likely cardiac given waxing and waning localized to the back symptoms. Home exercise and NSAIDs.  Return if symptoms worsen or fail to improve.  Lesleigh Noe, MD

## 2019-08-21 NOTE — Patient Instructions (Signed)
Check out youtube videos for Thoracic back pain exercises  Let me know if you are not improving over 2 weeks and would like to see physical therapy

## 2019-08-21 NOTE — Assessment & Plan Note (Signed)
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 No pain on exam. Discussed early referral to PT if not improving. Reassurance that less likely cardiac given waxing and waning localized to the back symptoms. Home exercise and NSAIDs.

## 2019-08-25 ENCOUNTER — Ambulatory Visit: Payer: POS

## 2019-09-01 ENCOUNTER — Ambulatory Visit: Payer: POS

## 2019-09-09 ENCOUNTER — Emergency Department (HOSPITAL_COMMUNITY)
Admission: EM | Admit: 2019-09-09 | Discharge: 2019-09-10 | Disposition: A | Discharge status: Home / Self Care | Payer: POS | Attending: Emergency Medicine | Admitting: Emergency Medicine

## 2019-09-09 ENCOUNTER — Telehealth: Payer: Self-pay

## 2019-09-09 ENCOUNTER — Other Ambulatory Visit: Payer: Self-pay

## 2019-09-09 ENCOUNTER — Encounter (HOSPITAL_COMMUNITY): Payer: Self-pay | Admitting: Emergency Medicine

## 2019-09-09 DIAGNOSIS — R42 Dizziness and giddiness: Secondary | ICD-10-CM | POA: Insufficient documentation

## 2019-09-09 DIAGNOSIS — Z79899 Other long term (current) drug therapy: Secondary | ICD-10-CM | POA: Diagnosis not present

## 2019-09-09 DIAGNOSIS — R002 Palpitations: Secondary | ICD-10-CM | POA: Diagnosis not present

## 2019-09-09 DIAGNOSIS — E038 Other specified hypothyroidism: Secondary | ICD-10-CM | POA: Insufficient documentation

## 2019-09-09 LAB — BASIC METABOLIC PANEL
Anion gap: 11 (ref 5–15)
BUN: 17 mg/dL (ref 6–20)
CO2: 24 mmol/L (ref 22–32)
Calcium: 9.8 mg/dL (ref 8.9–10.3)
Chloride: 102 mmol/L (ref 98–111)
Creatinine, Ser: 0.96 mg/dL (ref 0.44–1.00)
GFR calc Af Amer: >60 mL/min (ref >60)
GFR calc non Af Amer: >60 mL/min (ref >60)
Glucose, Bld: 102 mg/dL — ABNORMAL HIGH (ref 70–99)
Potassium: 4.6 mmol/L (ref 3.5–5.1)
Sodium: 137 mmol/L (ref 135–145)

## 2019-09-09 LAB — URINALYSIS, ROUTINE W REFLEX MICROSCOPIC
Bacteria, UA: NONE SEEN
Bilirubin Urine: NEGATIVE
Glucose, UA: NEGATIVE mg/dL
Ketones, ur: 5 mg/dL — AB
Nitrite: NEGATIVE
Protein, ur: NEGATIVE mg/dL
Specific Gravity, Urine: 1.005 (ref 1.005–1.030)
pH: 7 (ref 5.0–8.0)

## 2019-09-09 LAB — CBC
HCT: 46.1 % — ABNORMAL HIGH (ref 36.0–46.0)
Hemoglobin: 14.8 g/dL (ref 12.0–15.0)
MCH: 31.3 pg (ref 26.0–34.0)
MCHC: 32.1 g/dL (ref 30.0–36.0)
MCV: 97.5 fL (ref 80.0–100.0)
Platelets: 294 10*3/uL (ref 150–400)
RBC: 4.73 MIL/uL (ref 3.87–5.11)
RDW: 12.2 % (ref 11.5–15.5)
WBC: 9.2 10*3/uL (ref 4.0–10.5)
nRBC: 0 % (ref 0.0–0.2)

## 2019-09-09 LAB — I-STAT BETA HCG BLOOD, ED (MC, WL, AP ONLY): I-stat hCG, quantitative: <5 m[IU]/mL (ref <5)

## 2019-09-09 NOTE — Telephone Encounter (Signed)
Pt is sitting at work and twice in last few mins pt feels like she is going to pass out;pt declines 911; pts mgr will take [facility] to [facility] [address] which is near to where pt is working. Pt denies h/a, chest pain but pt said she is scared because she does feel like she is going to pass out; again pt declines 911. Pt will keep appt with Avie Echevaria NP on 09/10/19 incase needs FU appt. FYI to Avie Echevaria NP.

## 2019-09-09 NOTE — ED Triage Notes (Signed)
Pt reports coming from [facility] due to HTN and dizziness. Reports her hand and feet were feeling cold earlier but are back to normal. Has thyroid levels drawn and waiting results.

## 2019-09-09 NOTE — Telephone Encounter (Signed)
Tamara Mccoy called Tamara Mccoy; I spoke with Tamara Mccoy; on 08/30/19 Tamara Mccoy had lightheadedness that comes and goes;Tamara Mccoy had S/T,swollen glands and pain at base of neck in spine; Tamara Mccoy saw OB GYN that gave Tamara Mccoy an abx (Tamara Mccoy does not know name of abx) on 09/04/19; Tamara Mccoy said not having S/T now and neck pain comes and goes; when really hurting in neck pain level is 8. Now no neck pain at all. Tamara Mccoy said her eyes have hurt;but no drainage, redness or vision problems. Tamara Mccoy does have arm weakness for 1 yr which Tamara Mccoy associates with lifting heavy items above her head at work. Tamara Mccoy does not want sooner appt than what she has already scheduled(09/10/19 30' appt with Avie Echevaria NP). No other covid symptoms and Tamara Mccoy has not had known exposure to + covid. Tamara Mccoy did travel to [place] [facility] 08/23/19 - 08/30/19. FYI to Avie Echevaria NP.

## 2019-09-09 NOTE — Telephone Encounter (Signed)
noted 

## 2019-09-10 ENCOUNTER — Ambulatory Visit: Payer: POS | Admitting: Internal Medicine

## 2019-09-10 ENCOUNTER — Telehealth: Payer: Self-pay | Admitting: Internal Medicine

## 2019-09-10 NOTE — ED Provider Notes (Signed)
[facility] EMERGENCY DEPARTMENT Provider Note   CSN: [id number] Arrival date & time: 09/09/19  [id number]     History Chief Complaint  Patient presents with   Dizziness    Tamara Mccoy is a 54 y.o. female.  Patient presents to the emergency department with a chief complaint of lightheadedness.  She states that she had a few separate episodes of lightheadedness today with associated heart palpitations.  She states that she was seen at an urgent care and was told that she was "tachycardic."  She was given a dose of a beta-blocker and had improvement of her heart rate and symptoms.  She was released from the urgent care, and states that by the time she reached the pharmacy to pick up her medications she was having the symptoms again.  At this point she came to the emergency department for further evaluation.  She states that for the past several hours she has been feeling fine.  She has had no further lightheadedness or palpitations.  She states that her doctor thought it might have been her thyroid and sent off thyroid testing which will be back tomorrow.  She has follow-up with her doctor tomorrow.  She is also going to have an echocardiogram tomorrow.  She has no symptoms currently.  The history is provided by the patient. No language interpreter was used.       Past Medical History:  Diagnosis Date   Allergy    Anxiety    Depression    Female bladder prolapse    History of chicken pox    Hypothyroidism    Migraine     Patient Active Problem List   Diagnosis Date Noted   Upper back pain 08/21/2019   Migraines 09/11/2018   Anxiety and depression 06/15/2016   IBS (irritable bowel syndrome) 06/15/2016   Bilateral carpal tunnel syndrome 11/27/2015   Acquired hypothyroidism 10/27/2015    Past Surgical History:  Procedure Laterality Date   COLONOSCOPY  09/25/2007   COLONOSCOPY  04/2019   WISDOM TOOTH EXTRACTION       OB History    Gravida  3   Para  3   Term      Preterm      AB      Living  3     SAB      TAB      Ectopic      Multiple      Live Births              Family History  Problem Relation Age of Onset   COPD Mother    Kidney failure Mother        only one working    Heart Problems Mother        stent    Uterine cancer Mother    Heart block Father    Prostate cancer Father    Leukemia Maternal Grandmother    Leukemia Maternal Uncle    Heart attack Maternal Grandfather    Heart Problems Paternal Grandmother        bypass   Prostate cancer Paternal Grandfather    Colon cancer Neg Hx    Colon polyps Neg Hx    Esophageal cancer Neg Hx    Stomach cancer Neg Hx    Rectal cancer Neg Hx     Social History   Tobacco Use   Smoking status: Never Smoker   Smokeless tobacco: Never Used  Substance Use Topics  Alcohol use: Yes    Comment: occasional   Drug use: No    Home Medications Prior to Admission medications   Medication Sig Start Date End Date Taking? Authorizing Provider  buPROPion (WELLBUTRIN XL) 150 MG 24 hr tablet TAKE 1 TABLET(150 MG) BY MOUTH DAILY 09/10/18   Jearld Fenton, NP  FOLIC ACID PO Take by mouth.    [provider]  levonorgestrel (MIRENA) 20 MCG/24HR IUD 1 each by Intrauterine route once. Inserted 02/2016    [provider]  levothyroxine (SYNTHROID) 75 MCG tablet TAKE 1 TABLET(75 MCG) BY MOUTH DAILY BEFORE BREAKFAST MUST HAVE APPOINTMENT FOR REFILLS 05/26/19   Philemon Kingdom, MD  Multiple Vitamin (MULTIVITAMIN) tablet Take 1 tablet by mouth daily.    [provider]  omeprazole (PRILOSEC) 20 MG capsule TAKE 1 CAPSULE(20 MG) BY MOUTH DAILY 08/20/19   Jearld Fenton, NP  SELENIUM PO Take by mouth.    [provider]  simethicone (GAS-X) 80 MG chewable tablet Chew 1 tablet (80 mg total) by mouth every 6 (six) hours as needed for flatulence. Patient not taking: Reported on 08/21/2019 06/05/19    Petrucelli, Aldona Bar R, PA-C  vitamin B-12 (CYANOCOBALAMIN) 100 MCG tablet Take 100 mcg by mouth. Not daily    [provider]    Allergies    Contrast media [iodinated diagnostic agents], Erythromycin, Iohexol, Zithromax [azithromycin], and Penicillins  Review of Systems   Review of Systems  All other systems reviewed and are negative.   Physical Exam Updated Vital Signs BP 120/77 (BP Location: Left Arm)    Pulse 68    Temp 98.7 F (37.1 C) (Oral)    Resp 14    SpO2 100%   Physical Exam Vitals and nursing note reviewed.  Constitutional:      General: She is not in acute distress.    Appearance: She is well-developed.  HENT:     Head: Normocephalic and atraumatic.  Eyes:     Conjunctiva/sclera: Conjunctivae normal.  Cardiovascular:     Rate and Rhythm: Normal rate and regular rhythm.     Heart sounds: No murmur.  Pulmonary:     Effort: Pulmonary effort is normal. No respiratory distress.     Breath sounds: Normal breath sounds.  Abdominal:     Palpations: Abdomen is soft.     Tenderness: There is no abdominal tenderness.  Musculoskeletal:        General: Normal range of motion.     Cervical back: Neck supple.  Skin:    General: Skin is warm and dry.  Neurological:     Mental Status: She is alert and oriented to person, place, and time.  Psychiatric:        Mood and Affect: Mood normal.        Behavior: Behavior normal.     ED Results / Procedures / Treatments   Labs (all labs ordered are listed, but only abnormal results are displayed) Labs Reviewed  BASIC METABOLIC PANEL - Abnormal; Notable for the following components:      Result Value   Glucose, Bld 102 (*)    All other components within normal limits  CBC - Abnormal; Notable for the following components:   HCT 46.1 (*)    All other components within normal limits  URINALYSIS, ROUTINE W REFLEX MICROSCOPIC - Abnormal; Notable for the following components:   Color, Urine STRAW (*)    Hgb urine  dipstick SMALL (*)    Ketones, ur 5 (*)  Leukocytes,Ua TRACE (*)    All other components within normal limits  I-STAT BETA HCG BLOOD, ED ([facility], [facility], [facility] ONLY)    EKG EKG Interpretation  Date/Time:  Tuesday September 09 2019 18:52:46 EDT Ventricular Rate:  75 PR Interval:  168 QRS Duration: 72 QT Interval:  382 QTC Calculation: 426 R Axis:   94 Text Interpretation: Normal sinus rhythm Rightward axis Borderline ECG No previous ECGs available Confirmed by Ripley Fraise [phone]) on 09/10/2019 2:49:53 AM   Radiology No results found.  Procedures Procedures (including critical care time)  Medications Ordered in ED Medications - No data to display  ED Course  I have reviewed the triage vital signs and the nursing notes.  Pertinent labs & imaging results that were available during my care of the patient were reviewed by me and considered in my medical decision making (see chart for details).    MDM Rules/Calculators/A&P                      Patient with a couple of episodes of lightheadedness and palpitations today.  Was seen by an urgent care, and was told that her heart rate was fast.  She states that it was in the 170s.  She was given metoprolol and had good improvement of her symptoms.  She had one additional episode while at the pharmacy, then came to the emergency department for further evaluation.  Laboratory work-up today is reassuring.  She is not significantly anemic.  She has no evidence of infection on physical exam and has no leukocytosis.  She is not pregnant.  Urinalysis is unremarkable. EKG shows NSR.  Patient is not having any symptoms now.  VSS.  She is in no distress.  She appears stable for discharge and continued outpatient follow-up.   Final Clinical Impression(s) / ED Diagnoses Final diagnoses:  Lightheadedness    Rx / DC Orders ED Discharge Orders    None       Montine Circle, PA-C 09/10/19 [id number]    Ripley Fraise, MD 09/10/19 [phone]

## 2019-09-10 NOTE — Telephone Encounter (Signed)
Ok for work note? 

## 2019-09-10 NOTE — Telephone Encounter (Signed)
[place] Night - Client TELEPHONE ADVICE RECORD [facility] Patient Name: Tamara Mccoy Gender: Female DOB: 1965-11-28 Age: 54 Y 57 M 18 D Return Phone Number: [phone] (Primary) Address: City/State/Zip: [place] [address] Client [facility] Night - Client Client Site [place] Physician Webb Silversmith - NP Contact Type Call Who Is Calling Patient / Member / Family / Caregiver Call Type Triage / Clinical Relationship To Patient Self Return Phone Number [phone] (Primary) Chief Complaint FAINTING or [place] Reason for Call Symptomatic / Request for [place] states she wants to know if she can move appointment up. Blood pressure is 150/101, has had two calonadapins. She feels like she is going to pass out. Translation No Nurse Assessment Nurse: Aris Lot, RN, Christina Date/Time (Eastern Time): 09/09/2019 6:02:25 PM Confirm and document reason for call. If symptomatic, describe symptoms. ---Caller states she wants to know if she can move appointment up. Blood pressure is 150/101, has had two clonidine She feels like she is going to pass out. No other symptoms Has the patient had close contact with a person known or suspected to have the novel coronavirus illness OR traveled / lives in area with major community spread (including international travel) in the last 14 days from the onset of symptoms? * If Asymptomatic, screen for exposure and travel within the last 14 days. ---No Does the patient have any new or worsening symptoms? ---Yes Will a triage be completed? ---Yes Related visit to physician within the last 2 weeks? ---No Does the PT have any chronic conditions? (i.e. diabetes, asthma, this includes High risk factors for pregnancy, etc.) ---Yes List chronic conditions. ---HTN, Thyroid Is this a behavioral health or substance abuse call?  ---No Guidelines Guideline Title Affirmed Question Affirmed Notes Nurse Date/Time (Eastern Time) Blood Pressure - High [id number] Systolic BP >= 0000000 OR Diastolic >= 123XX123 AND [id number] cardiac or neurologic symptoms (e.g., chest pain, difficulty Aris Lot, RN, Margreta Journey 09/09/2019 6:04:08 PM PLEASE NOTE: All timestamps contained within this report are represented as [place] Standard Time. CONFIDENTIALTY NOTICE: This fax transmission is intended only for the addressee. It contains information that is legally privileged, confidential or otherwise protected from use or disclosure. If you are not the intended recipient, you are strictly prohibited from reviewing, disclosing, copying using or disseminating any of this information or taking any action in reliance on or regarding this information. If you have received this fax in error, please notify us immediately by telephone so that we can arrange for its return to [place]. Phone: [phone], Toll-Free: [phone], Fax: [phone] Page: 2 of 2 Call Id: [id number] Guidelines Guideline Title Affirmed Question Affirmed Notes Nurse Date/Time Eilene Ghazi Time) breathing, unsteady gait, blurred vision) Disp. Time Eilene Ghazi Time) Disposition Final User 09/09/2019 6:00:46 PM Send to Urgent Ezra Sites 09/09/2019 6:05:44 PM Go to ED Now Yes Aris Lot, RN, Willeen Cass Disagree/Comply Comply Caller Understands Yes PreDisposition InappropriateToAsk Care Advice Given Per Guideline GO TO ED NOW: * You need to be seen in the Emergency Department. * Go to the ED at ___________ [place] now. Drive carefully. NOTE TO TRIAGER - DRIVING: * Another adult should drive. * If immediate transportation is not available via car or taxi, then the patient should be instructed to call [id number]. CALL EMS 911 IF: * Patient passes out, starts acting confused or becomes too weak to stand. * You become worse. CARE ADVICE given per High Blood Pressure (Adult)  guideline.  Referrals [facility] - ED

## 2019-09-10 NOTE — Telephone Encounter (Signed)
Pt was scheduled today with Tamara Silversmith, NP at 3:45pm - cancelled   This appt was cancelled by another office and rescheduled to a random date - the patient did not authorize this. The front staff/supervisors are looking into this. I explained to the patient that this was probably done In error - they most likely cancelled the appt in error and when they went to schedule it back in it jumped to the first available day. We have apologized for the inconvenience to the patient and we were able to get her scheduled with Tamara Mccoy quickly - pt being seen on Thur 4/22 at 8:15 -- this is to determine if she possibly needs a heart monitor -- pt seen in ED 4/20.   Will send to [facility] to help look into this as well as Tamara Mccoy.   Tamara Mccoy, the patient was already planning on coming to today appts so therefore she missed work and will now have to miss work tomorrow to come in, are you able to write a work note to cover today and tomorrows appt?

## 2019-09-10 NOTE — Discharge Instructions (Addendum)
Ask your doctor if you need to wear a heart monitor.  No clear cause of your symptoms was found in the ER tonight.  Please follow-up with your doctor.  Return to the ER for new or worsening symptoms.

## 2019-09-11 ENCOUNTER — Encounter: Payer: Self-pay | Admitting: Internal Medicine

## 2019-09-11 ENCOUNTER — Other Ambulatory Visit: Payer: Self-pay

## 2019-09-11 ENCOUNTER — Ambulatory Visit: Payer: POS | Admitting: Internal Medicine

## 2019-09-11 VITALS — BP 122/76 | HR 83 | Temp 97.7°F | Wt 127.0 lb

## 2019-09-11 DIAGNOSIS — R42 Dizziness and giddiness: Secondary | ICD-10-CM

## 2019-09-11 DIAGNOSIS — R002 Palpitations: Secondary | ICD-10-CM | POA: Diagnosis not present

## 2019-09-11 NOTE — Progress Notes (Signed)
Subjective:    Patient ID: Tamara Mccoy, female    DOB: 06-07-1965, 54 y.o.   MRN: [id number]  HPI  Patient presents today for urgent care and ER follow-up for lightheadedness and palpitations.  She reports on 09/09/2018, she had a couple episodes of lightheadedness with palpitations at work.  She was taken to urgent care where she was told her heart rate was in the 170s.  They gave her Clonidine and gave her ar RX for Metoprolol and discharged. She reports she was at the pharmacy picking up medications when she had another episode of lightheadedness and palpitations.  She presented to the ER for further evaluation.  Labs were unremarkable.  EKG showed normal sinus rhythm with no acute changes.  She was discharged and advised to follow-up with her PCP. Since discharge, she has not had any more episodes of lightheadedness or palpitations. She does feel a little weak. She has not taken the Metoprolol. She was advised she may need to follow up with cardiology.  Review of Systems  Past Medical History:  Diagnosis Date  . Allergy   . Anxiety   . Depression   . Female bladder prolapse   . History of chicken pox   . Hypothyroidism   . Migraine     Current Outpatient Medications  Medication Sig Dispense Refill  . buPROPion (WELLBUTRIN XL) 150 MG 24 hr tablet TAKE 1 TABLET(150 MG) BY MOUTH DAILY 30 tablet 11  . FOLIC ACID PO Take by mouth.    . levonorgestrel (MIRENA) 20 MCG/24HR IUD 1 each by Intrauterine route once. Inserted 02/2016    . levothyroxine (SYNTHROID) 75 MCG tablet TAKE 1 TABLET(75 MCG) BY MOUTH DAILY BEFORE BREAKFAST MUST HAVE APPOINTMENT FOR REFILLS 90 tablet 0  . Multiple Vitamin (MULTIVITAMIN) tablet Take 1 tablet by mouth daily.    Marland Kitchen omeprazole (PRILOSEC) 20 MG capsule TAKE 1 CAPSULE(20 MG) BY MOUTH DAILY 30 capsule 1  . SELENIUM PO Take by mouth.    . simethicone (GAS-X) 80 MG chewable tablet Chew 1 tablet (80 mg total) by mouth every 6 (six) hours as needed for  flatulence. (Patient not taking: Reported on 08/21/2019) 15 tablet 0  . vitamin B-12 (CYANOCOBALAMIN) 100 MCG tablet Take 100 mcg by mouth. Not daily     No current facility-administered medications for this visit.    Allergies  Allergen Reactions  . Contrast Media [Iodinated Diagnostic Agents] Hives  . Erythromycin Other (See Comments)    unknown  . Iohexol      Code: HIVES, Desc: Hives on face post injection of 100cc's Omni., Onset Date: [id number]   . Zithromax [Azithromycin] Other (See Comments)    Heart race  . Penicillins Rash    Family History  Problem Relation Age of Onset  . COPD Mother   . Kidney failure Mother        only one working   . Heart Problems Mother        stent   . Uterine cancer Mother   . Heart block Father   . Prostate cancer Father   . Leukemia Maternal Grandmother   . Leukemia Maternal Uncle   . Heart attack Maternal Grandfather   . Heart Problems Paternal Grandmother        bypass  . Prostate cancer Paternal Grandfather   . Colon cancer Neg Hx   . Colon polyps Neg Hx   . Esophageal cancer Neg Hx   . Stomach cancer Neg Hx   . Rectal  cancer Neg Hx     Social History   Socioeconomic History  . Marital status: Married    Spouse name: Octavia Bruckner  . Number of children: 3  . Years of education: 88  . Highest education level: Not on file  Occupational History    Employer: [place] [employer]  Tobacco Use  . Smoking status: Never Smoker  . Smokeless tobacco: Never Used  Substance and Sexual Activity  . Alcohol use: Yes    Comment: occasional  . Drug use: No  . Sexual activity: Yes    Partners: Male    Comment: 11st intercourse- 29, partners- 4, current partner- 6 mo  Other Topics Concern  . Not on file  Social History Narrative   Patient lives at home with husband Octavia Bruckner and one son.    Patient has 3 children.    Patient has some college.    Patient works at [employer].    Social Determinants of Health   Financial Resource Strain:   . Difficulty of  Paying Living Expenses:   Food Insecurity:   . Worried About Charity fundraiser in the Last Year:   . Arboriculturist in the Last Year:   Transportation Needs:   . Film/video editor (Medical):   Marland Kitchen Lack of Transportation (Non-Medical):   Physical Activity:   . Days of Exercise per Week:   . Minutes of Exercise per Session:   Stress:   . Feeling of Stress :   Social Connections:   . Frequency of Communication with Friends and Family:   . Frequency of Social Gatherings with Friends and Family:   . Attends Religious Services:   . Active Member of Clubs or Organizations:   . Attends Archivist Meetings:   Marland Kitchen Marital Status:   Intimate Partner Violence:   . Fear of Current or Ex-Partner:   . Emotionally Abused:   Marland Kitchen Physically Abused:   . Sexually Abused:      Constitutional: Denies fever, malaise, fatigue, headache or abrupt weight changes.  HEENT: Denies eye pain, eye redness, ear pain, ringing in the ears, wax buildup, runny nose, nasal congestion, bloody nose, or sore throat. Respiratory: Denies difficulty breathing, shortness of breath, cough or sputum production.   Cardiovascular: Pt reports palpitations. Denies chest pain, chest tightness, or swelling in the hands or feet.  Neurological: Pt reports lightheadedness. Denies difficulty with memory, difficulty with speech or problems with balance and coordination.  Psych: Pt reports stress, history of anxiety and depression. Denies SI/HI.  No other specific complaints in a complete review of systems (except as listed in HPI above).     Objective:   Physical Exam  BP 122/76   Pulse 83   Temp 97.7 F (36.5 C) (Temporal)   Wt 127 lb (57.6 kg)   SpO2 98%   BMI 23.23 kg/m   Wt Readings from Last 3 Encounters:  08/21/19 128 lb (58.1 kg)  08/01/19 130 lb (59 kg)  07/16/19 130 lb (59 kg)    General: Appears her stated age, well developed, well nourished in NAD. Neck:  Neck supple, trachea midline. No masses,  lumps or thyromegaly present.  Cardiovascular: Normal rate and rhythm. S1,S2 noted.  No murmur, rubs or gallops noted. Pulmonary/Chest: Normal effort and positive vesicular breath sounds. No respiratory distress. No wheezes, rales or ronchi noted.  Neurological: Alert and oriented.  Psychiatric: Mood and affect normal. Behavior is normal. Judgment and thought content normal.    BMET  Component Value Date/Time   NA 137 09/09/2019 1905   K 4.6 09/09/2019 1905   CL 102 09/09/2019 1905   CO2 24 09/09/2019 1905   GLUCOSE 102 (H) 09/09/2019 1905   BUN 17 09/09/2019 1905   CREATININE 0.96 09/09/2019 1905   CALCIUM 9.8 09/09/2019 1905   GFRNONAA >60 09/09/2019 1905   GFRAA >60 09/09/2019 1905    Lipid Panel     Component Value Date/Time   CHOL 199 08/08/2017 1545   TRIG 76.0 08/08/2017 1545   HDL 79.40 08/08/2017 1545   CHOLHDL 3 08/08/2017 1545   VLDL 15.2 08/08/2017 1545   LDLCALC 105 (H) 08/08/2017 1545    CBC    Component Value Date/Time   WBC 9.2 09/09/2019 1905   RBC 4.73 09/09/2019 1905   HGB 14.8 09/09/2019 1905   HCT 46.1 (H) 09/09/2019 1905   PLT 294 09/09/2019 1905   MCV 97.5 09/09/2019 1905   MCH 31.3 09/09/2019 1905   MCHC 32.1 09/09/2019 1905   RDW 12.2 09/09/2019 1905   LYMPHSABS 0.8 06/05/2019 0922   MONOABS 0.5 06/05/2019 0922   EOSABS 0.1 06/05/2019 0922   BASOSABS 0.0 06/05/2019 0922    Hgb A1C Lab Results  Component Value Date   HGBA1C 5.7 02/04/2019          Assessment & Plan:   UC/ER Follow Up for Lightheadedness, Palpitations:  UC and ER notes, labs and imaging reviewed. Will obtain records from [place]- thyroid and chest xray Advised her if it happens again, to take Metoprolol and call me Will refer to cardiology for Holter Monitor if this occurs again  Will follow up after labs, return precautions discussed Webb Silversmith, NP This visit occurred during the SARS-CoV-2 public health emergency.  Safety protocols were in place,  including screening questions prior to the visit, additional usage of staff PPE, and extensive cleaning of exam room while observing appropriate contact time as indicated for disinfecting solutions.

## 2019-09-11 NOTE — Patient Instructions (Signed)
Palpitations Palpitations are feelings that your heartbeat is not normal. Your heartbeat may feel like it is:  Uneven.  Faster than normal.  Fluttering.  Skipping a beat. This is usually not a serious problem. In some cases, you may need tests to rule out any serious problems. Follow these instructions at home: Pay attention to any changes in your condition. Take these actions to help manage your symptoms: Eating and drinking  Avoid: ? Coffee, tea, soft drinks, and energy drinks. ? Chocolate. ? Alcohol. ? Diet pills. Lifestyle   Try to lower your stress. These things can help you relax: ? Yoga. ? Deep breathing and meditation. ? Exercise. ? Using words and images to create positive thoughts (guided imagery). ? Using your mind to control things in your body (biofeedback).  Do not use drugs.  Get plenty of rest and sleep. Keep a regular bed time. General instructions   Take over-the-counter and prescription medicines only as told by your doctor.  Do not use any products that contain nicotine or tobacco, such as cigarettes and e-cigarettes. If you need help quitting, ask your doctor.  Keep all follow-up visits as told by your doctor. This is important. You may need more tests if palpitations do not go away or get worse. Contact a doctor if:  Your symptoms last more than 24 hours.  Your symptoms occur more often. Get help right away if you:  Have chest pain.  Feel short of breath.  Have a very bad headache.  Feel dizzy.  Pass out (faint). Summary  Palpitations are feelings that your heartbeat is uneven or faster than normal. It may feel like your heart is fluttering or skipping a beat.  Avoid food and drinks that may cause palpitations. These include caffeine, chocolate, and alcohol.  Try to lower your stress. Do not smoke or use drugs.  Get help right away if you faint or have chest pain, shortness of breath, a severe headache, or dizziness. This  information is not intended to replace advice given to you by your health care provider. Make sure you discuss any questions you have with your health care provider. Document Revised: 06/20/2017 Document Reviewed: 06/20/2017 Elsevier Patient Education  2020 Elsevier Inc.  

## 2019-09-16 ENCOUNTER — Ambulatory Visit: Payer: POS | Admitting: Internal Medicine

## 2019-09-23 ENCOUNTER — Other Ambulatory Visit: Payer: Self-pay | Admitting: Adult Medicine

## 2019-09-23 ENCOUNTER — Other Ambulatory Visit: Payer: Self-pay | Admitting: Internal Medicine

## 2019-09-23 DIAGNOSIS — R531 Weakness: Secondary | ICD-10-CM

## 2019-09-23 DIAGNOSIS — R42 Dizziness and giddiness: Secondary | ICD-10-CM

## 2019-09-29 ENCOUNTER — Telehealth: Payer: Self-pay

## 2019-09-29 NOTE — Telephone Encounter (Signed)
I received a staff message from [place] that patient would like to have her surgery scheduled in August.

## 2019-10-02 ENCOUNTER — Other Ambulatory Visit: Payer: Self-pay

## 2019-10-06 ENCOUNTER — Ambulatory Visit: Payer: POS | Admitting: Internal Medicine

## 2019-10-06 ENCOUNTER — Other Ambulatory Visit: Payer: Self-pay

## 2019-10-06 ENCOUNTER — Encounter: Payer: Self-pay | Admitting: Internal Medicine

## 2019-10-06 VITALS — BP 120/76 | HR 94 | Ht 62.0 in | Wt 127.0 lb

## 2019-10-06 DIAGNOSIS — Z8632 Personal history of gestational diabetes: Secondary | ICD-10-CM | POA: Diagnosis not present

## 2019-10-06 DIAGNOSIS — E039 Hypothyroidism, unspecified: Secondary | ICD-10-CM | POA: Diagnosis not present

## 2019-10-06 LAB — POCT GLYCOSYLATED HEMOGLOBIN (HGB A1C): Hemoglobin A1C: 5.3 % (ref 4.0–5.6)

## 2019-10-06 NOTE — Patient Instructions (Addendum)
Please stop at the lab.  Please continue 100 mcg daily.  Take the thyroid hormone every day, with water, at least 30 minutes before breakfast, separated by at least 4 hours from: - acid reflux medications - calcium - iron - multivitamins  Please come back for a follow-up appointment in 1 year.

## 2019-10-06 NOTE — Progress Notes (Signed)
Patient ID: Tamara Mccoy, female   DOB: August 01, 1965, 54 y.o.   MRN: [id number]   This visit occurred during the SARS-CoV-2 public health emergency.  Safety protocols were in place, including screening questions prior to the visit, additional usage of staff PPE, and extensive cleaning of exam room while observing appropriate contact time as indicated for disinfecting solutions.   HPI  Tamara Mccoy is a 54 y.o.-year-old female, returning for follow-up for Hashimoto's hypothyroidism and thyroid nodules.  He saw Dr. Posey Pronto before, at [facility] endocrinology.  Last visit 1 year and 6 months ago.  She describes that ~1 mo ago, after she returned from the beach, she had an episode of dizziness, sensation of passing out, HA, palpitations, high BP, sore throat, hoarseness.   She went to the ED >> found to have high blood pressure and started on medication for this.  TSH was found to be high >> LT4 was increased to 100 mcg.   Pt. has been dx with hypothyroidism in ~2014 >> on Levothyroxine 88 mcg (she was on this for a long time) >> then decreased to 75 mcg daily.  Pt continues on levothyroxine 100 mcg daily, taken: - in am - fasting - at least 30 min from b'fast - no Ca, Fe, PPIs, MVI - not on Biotin  Reviewed patient's TFTs: 09/04/2019: TSH 7.7 Lab Results  Component Value Date   TSH 1.56 02/04/2019   TSH 0.78 10/23/2018   TSH 0.49 04/24/2018   TSH 0.65 12/18/2017   TSH 0.07 (L) 11/14/2017   TSH 0.06 (L) 09/12/2017   TSH 6.92 (H) 08/08/2017   FREET4 1.02 10/23/2018   FREET4 0.93 04/24/2018   FREET4 0.90 12/18/2017   FREET4 1.16 11/14/2017   FREET4 0.94 09/12/2017   FREET4 0.78 08/08/2017   T3FREE 3.1 11/14/2017   ATA antibodies were not elevated: Lab Results  Component Value Date   THGAB <1 11/14/2017   Her TPO antibodies are high: Component     Latest Ref Rng & Units 11/14/2017  Thyroperoxidase Ab SerPl-aCnc     <9 IU/mL 54 (H)   Thyroid nodules:  Reviewed previous  ultrasound reports: 07/2010: Right thyroid lobe:  3.9 x 1.1 x 1.4 cm Left thyroid lobe:  3.6 x 1.1 x 1.1 cm Isthmus:  5 mm Focal nodules:   Thyroid echotexture is heterogeneous.  No well- defined nodule or dominant mass identified.   Possible hypoechoic lesion measures 8 mm in the inter/lower pole right lobe on image 16. Suspect a hypoechoic nodule within the medial interpolar right lobe.  1.0 x 0.9 x 0.8 cm on image 9 image 26. A 5 mm hyperechoic nodule in the lower pole left lobe on image 41. A 7 mm hyperechoic upper pole left sided nodule. No abnormal calcifications. Lymphadenopathy:  None visualized.  10/2016 -thyroid ultrasound performed in Dr. Serita Grit office:   The thyroid nodule is fading away and is currently measuring 0.5 x 0.3 cm. Repeat ultrasound in 2-3 years.   Pt denies: - feeling nodules in neck - dysphagia - choking - SOB with lying down But continues to have hoarseness.  She has no FH of thyroid disorders. No FH of thyroid cancer. No h/o radiation tx to head or neck.  No herbal supplements. No Biotin use. No recent steroids use.   Pt. also has a history of anxiety, depression (grief at losing husband in 05/2017).   She has a GDM hx. Her HbA1c was borderline at last OV: 5.7%.  ROS: Constitutional: no  weight gain/no weight loss, no fatigue, + subjective hyperthermia, no subjective hypothermia Eyes: no blurry vision, no xerophthalmia ENT: no sore throat, + see HPI Cardiovascular: no CP/no SOB/no palpitations/no leg swelling Respiratory: no cough/no SOB/no wheezing Gastrointestinal: no N/no V/no D/no C/no acid reflux Musculoskeletal: no muscle aches/no joint aches Skin: no rashes, no hair loss Neurological: no tremors/no numbness/no tingling/+ dizziness, + headache  I reviewed pt's medications, allergies, PMH, social hx, family hx, and changes were documented in the history of present illness. Otherwise, unchanged from my initial visit note.  Past Medical  History:  Diagnosis Date  . Allergy   . Anxiety   . Depression   . Female bladder prolapse   . History of chicken pox   . Hypothyroidism   . Migraine    Past Surgical History:  Procedure Laterality Date  . COLONOSCOPY  09/25/2007  . COLONOSCOPY  04/2019  . WISDOM TOOTH EXTRACTION     Social History   Socioeconomic History  . Marital status: Married    Spouse name: Octavia Bruckner  . Number of children: 3  . Years of education: 80  . Highest education level: Not on file  Occupational History    Employer: [employer]  Tobacco Use  . Smoking status: Never Smoker  . Smokeless tobacco: Never Used  Substance and Sexual Activity  . Alcohol use: Yes    Comment: occasional  . Drug use: No  . Sexual activity: Yes    Partners: Male    Comment: 11st intercourse- 29, partners- 4, current partner- 6 mo  Other Topics Concern  . Not on file  Social History Narrative   Patient lives at home with husband Octavia Bruckner and one son.    Patient has 3 children.    Patient has some college.    Patient works at [employer].    Social Determinants of Health   Financial Resource Strain:   . Difficulty of Paying Living Expenses:   Food Insecurity:   . Worried About Charity fundraiser in the Last Year:   . Arboriculturist in the Last Year:   Transportation Needs:   . Film/video editor (Medical):   Marland Kitchen Lack of Transportation (Non-Medical):   Physical Activity:   . Days of Exercise per Week:   . Minutes of Exercise per Session:   Stress:   . Feeling of Stress :   Social Connections:   . Frequency of Communication with Friends and Family:   . Frequency of Social Gatherings with Friends and Family:   . Attends Religious Services:   . Active Member of Clubs or Organizations:   . Attends Archivist Meetings:   Marland Kitchen Marital Status:   Intimate Partner Violence:   . Fear of Current or Ex-Partner:   . Emotionally Abused:   Marland Kitchen Physically Abused:   . Sexually Abused:    Current Outpatient Medications  on File Prior to Visit  Medication Sig Dispense Refill  . buPROPion (WELLBUTRIN XL) 150 MG 24 hr tablet Take 1 tablet (150 mg total) by mouth daily. SCHEDULE PHYSICAL EXAM 30 tablet 1  . levonorgestrel (MIRENA) 20 MCG/24HR IUD 1 each by Intrauterine route once. Inserted 02/2016    . levothyroxine (SYNTHROID) 75 MCG tablet TAKE 1 TABLET(75 MCG) BY MOUTH DAILY BEFORE BREAKFAST MUST HAVE APPOINTMENT FOR REFILLS 90 tablet 0  . Multiple Vitamin (MULTIVITAMIN) tablet Take 1 tablet by mouth daily.    Marland Kitchen omeprazole (PRILOSEC) 20 MG capsule TAKE 1 CAPSULE(20 MG) BY MOUTH  DAILY 30 capsule 1  . SELENIUM PO Take by mouth.    . simethicone (GAS-X) 80 MG chewable tablet Chew 1 tablet (80 mg total) by mouth every 6 (six) hours as needed for flatulence. 15 tablet 0   No current facility-administered medications on file prior to visit.   Allergies  Allergen Reactions  . Contrast Media [Iodinated Diagnostic Agents] Hives  . Erythromycin Other (See Comments)    unknown  . Iohexol      Code: HIVES, Desc: Hives on face post injection of 100cc's Omni., Onset Date: [id number]   . Zithromax [Azithromycin] Other (See Comments)    Heart race  . Penicillins Rash   Family History  Problem Relation Age of Onset  . COPD Mother   . Kidney failure Mother        only one working   . Heart Problems Mother        stent   . Uterine cancer Mother   . Heart block Father   . Prostate cancer Father   . Leukemia Maternal Grandmother   . Leukemia Maternal Uncle   . Heart attack Maternal Grandfather   . Heart Problems Paternal Grandmother        bypass  . Prostate cancer Paternal Grandfather   . Colon cancer Neg Hx   . Colon polyps Neg Hx   . Esophageal cancer Neg Hx   . Stomach cancer Neg Hx   . Rectal cancer Neg Hx     PE: BP 120/76   Pulse 94   Ht 5\' 2"  (1.575 m)   Wt 127 lb (57.6 kg)   SpO2 98%   BMI 23.23 kg/m  Wt Readings from Last 3 Encounters:  10/06/19 127 lb (57.6 kg)  09/11/19 127 lb (57.6 kg)   08/21/19 128 lb (58.1 kg)   Constitutional: normal weight, in NAD Eyes: PERRLA, EOMI, no exophthalmos ENT: moist mucous membranes, no thyromegaly, no cervical lymphadenopathy Cardiovascular: Tachycardia, RR, No MRG Respiratory: CTA B Gastrointestinal: abdomen soft, NT, ND, BS+ Musculoskeletal: no deformities, strength intact in all 4 Skin: moist, warm, no rashes Neurological: no tremor with outstretched hands, DTR normal in all 4  ASSESSMENT: 1.  Hypothyroidism due to Hashimoto thyroiditis  2. Thyroid nodules  3. H/o GDM  PLAN:  1. Patient with longstanding hypothyroidism, diagnosed as Hashimoto's thyroiditis in 2019. She continues on levothyroxine therapy. - latest thyroid labs reviewed with pt.: She had a perfect TSH, of 1.56 in 01/2019, however, a month ago the TSH was checked in the urgent care, after she presented with dizziness and high blood pressure and this was found to be elevated, at 7.7.  At that time, her levothyroxine dose was increased from 75 to 100 mcg daily. - pt feels good on this dose.  However, her pulse is high today. - we discussed about taking the thyroid hormone every day, with water, >30 minutes before breakfast, separated by >4 hours from acid reflux medications, calcium, iron, multivitamins. Pt. is taking it correctly. - will check thyroid tests today: TSH and fT4 - If labs are abnormal, she will need to return for repeat TFTs in 1.5 months - RTC in 1 year  3. Thyroid nodules  -no neck compression symptoms -Reviewed the thyroid ultrasound report from 2012 and 2018: She has a history of pseudonodules, which are inflammatory areas, most likely in the setting of thyroiditis.  These are usually benign and, since they are small and not worrisome, no further ultrasounds are needed.  4. H/o GDM -  At last visit, per her request, we checked her HbA1c: 5.5% -Latest HbA1c was from 01/2009 and is most in the borderline prediabetic range, at 5.7% -We will recheck an  HbA1c today >> 5.3% (better)  Needs refills.  Component     Latest Ref Rng & Units 10/06/2019  TSH     0.35 - 4.50 uIU/mL 0.13 (L)  T4,Free(Direct)     0.60 - 1.60 ng/dL 1.28  Hemoglobin A1C     4.0 - 5.6 % 5.3  TSH is suppressed >> will decrease levothyroxine to 88 mcg daily and recheck her test in 1.5 months.  Philemon Kingdom, MD PhD [facility] Endocrinology

## 2019-10-07 LAB — TSH: TSH: 0.13 u[IU]/mL — ABNORMAL LOW (ref 0.35–4.50)

## 2019-10-07 LAB — T4, FREE: Free T4: 1.28 ng/dL (ref 0.60–1.60)

## 2019-10-07 MED ORDER — LEVOTHYROXINE SODIUM 88 MCG PO TABS: 88.0000 ug | ORAL_TABLET | Freq: Every day | ORAL | 3 refills | Status: DC

## 2019-10-08 NOTE — Telephone Encounter (Signed)
I called patient to let her know I have the August scheduled available now for surgery.  I scheduled her for 12/30/19 at 8:30am at [facility].  I advised her regarding need for Covid test prior and reviewed the quarantine protocol. Appt scheduled accordingly.  We discussed her ins benefits and her est GGA surgery prepayment due by one week prior to surgery and I will mail her a financial letter as well.  Pre op appt was scheduled with Dr. Marguerita Merles. Packet mailed.

## 2019-10-14 ENCOUNTER — Telehealth: Payer: Self-pay

## 2019-10-14 NOTE — Telephone Encounter (Signed)
Scheduled for XI Robotic Supracervical Hysterectomy with Bilateral Salpingectomy and Sacrocolpopexy, possible Anterior repair.  Patient asks how long after surgery until she can take a 3 hour flight to [place] to stay with a friend?

## 2019-10-14 NOTE — Telephone Encounter (Signed)
If going there to rest, should be fine to travel 1 week after surgery provided she is having a normal recovery.

## 2019-10-14 NOTE — Telephone Encounter (Signed)
Patient called in voice mail stating she had question regarding the Covid appt on 12/26/19. I called her back and left message to call in her voice mail and told her to feel free to leave her question in voice mail if she would like.

## 2019-10-15 NOTE — Telephone Encounter (Signed)
Spoke with patient and informed her. °

## 2019-11-05 ENCOUNTER — Other Ambulatory Visit: Payer: POS

## 2019-11-18 ENCOUNTER — Encounter: Payer: Self-pay | Admitting: Anesthesiology

## 2019-11-25 ENCOUNTER — Telehealth: Payer: Self-pay

## 2019-11-25 NOTE — Telephone Encounter (Signed)
Patient called to inquire if pre op Covid testing/quarantine protocol has changed as she is getting ready to file her leave. I advised her that I should be getting word soon. I will call her as soon as I hear anything.

## 2019-11-28 ENCOUNTER — Ambulatory Visit: Payer: 59 | Admitting: Obstetrics & Gynecology

## 2019-11-29 ENCOUNTER — Other Ambulatory Visit: Payer: Self-pay | Admitting: Internal Medicine

## 2019-12-01 ENCOUNTER — Other Ambulatory Visit: Payer: Self-pay | Admitting: Internal Medicine

## 2019-12-02 ENCOUNTER — Telehealth: Payer: Self-pay

## 2019-12-02 NOTE — Telephone Encounter (Signed)
[facility] - Client TELEPHONE ADVICE RECORD [facility] Patient Name: Tamara Mccoy Gender: Female DOB: 06/16/1965 Age: 54 Y 11 M 10 D Return Phone Number: [phone] (Primary) Address: City/State/Zip: [place] [address] Client [place] [facility] - Client Client Site [place] Physician Webb Silversmith - NP Contact Type Call Who Is Calling Patient / Member / Family / Caregiver Call Type Triage / Clinical Relationship To Patient Self Return Phone Number [phone] (Primary) Chief Complaint Prescription Refill or Medication Request (non symptomatic) Reason for Call Medication Question / Request Initial Comment Caller states her Rx bupropion xl 150 mg has run out and she has been trying to get it filled but cannot get a MD approval. Translation No Nurse Assessment Nurse: Luvenia Heller, RN, Adriana Date/Time (Eastern Time): 12/01/2019 7:30:20 PM Confirm and document reason for call. If symptomatic, describe symptoms. ---Caller states her Rx bupropion xl 150 mg has run out and she has been trying to get it filled but cannot get a MD approval. Last dose taken on Saturday. Pharmacy is waiting for approval from MD. Pharmacy [facility] in [place]. [phone] Has the patient had close contact with a person known or suspected to have the novel coronavirus illness OR traveled / lives in area with major community spread (including international travel) in the last 14 days from the onset of symptoms? * If Asymptomatic, screen for exposure and travel within the last 14 days. ---No Does the patient have any new or worsening symptoms? ---Yes Will a triage be completed? ---Yes Related visit to physician within the last 2 weeks? ---No Does the PT have any chronic conditions? (i.e. diabetes, asthma, this includes High risk factors for pregnancy, etc.) ---Yes List chronic conditions. ---anxiety,  hyperthyroid, Is this a behavioral health or substance abuse call? ---No Nurse: Luvenia Heller, RN, Adriana Date/Time (Eastern Time): 12/01/2019 7:33:22 PM Please select the assessment type ---Refill Does the patient have enough medication to last until the office opens? ---No PLEASE NOTE: All timestamps contained within this report are represented as [place] Standard Time. CONFIDENTIALTY NOTICE: This fax transmission is intended only for the addressee. It contains information that is legally privileged, confidential or otherwise protected from use or disclosure. If you are not the intended recipient, you are strictly prohibited from reviewing, disclosing, copying using or disseminating any of this information or taking any action in reliance on or regarding this information. If you have received this fax in error, please notify us immediately by telephone so that we can arrange for its return to [place]. Phone: [phone], Toll-Free: [phone], Fax: [phone] Page: 2 of 2 Call Id: [id number] Guidelines Guideline Title Affirmed Question Affirmed Notes Nurse Date/Time Eilene Ghazi Time) Disp. Time Eilene Ghazi Time) Disposition Final User 12/01/2019 7:09:10 PM Attempt made - message left Luvenia Heller, RN, Fabio Bering 12/01/2019 7:34:33 PM Clinical Call Yes Luvenia Heller, RN, Fabio Bering

## 2019-12-03 NOTE — Telephone Encounter (Signed)
Do not see under historical either. Referral denied.

## 2019-12-03 NOTE — Telephone Encounter (Signed)
Metoprolol is not current med list... please advise

## 2019-12-09 ENCOUNTER — Encounter: Payer: Self-pay | Admitting: Obstetrics & Gynecology

## 2019-12-09 ENCOUNTER — Other Ambulatory Visit: Payer: Self-pay

## 2019-12-09 ENCOUNTER — Ambulatory Visit: Payer: 59 | Admitting: Obstetrics & Gynecology

## 2019-12-09 DIAGNOSIS — N813 Complete uterovaginal prolapse: Secondary | ICD-10-CM

## 2019-12-09 NOTE — Progress Notes (Signed)
    Tamara Mccoy 08-10-1965 [id number]        54 y.o.  G3P3   RP: Complete uterine prolapse with Cystocele grade 4/4 for Preop Op XI Robotic Supracervical Hysterectomy with Bilateral Salpingectomy, Sacrocolpopexy.  Possible Cystocele repair  HPI: Very symptomatic complete uterine prolapse with cystocele.  Patient has decreased her physical activity because of discomfort and pain when walking due to the uterine prolapse and cystocele.  Experiences discomfort and pain from the rubbing of the protrusion at the vulva.  Has to push the bladder back in the vagina to pass urine.  Occasional leakage of urine and urgency.  Bowel movements normal.  Postmenopausal x1 year.  No hormone replacement therapy.  No postmenopausal bleeding.   OB History  Gravida Para Term Preterm AB Living  3 3       3   SAB TAB Ectopic Multiple Live Births               # Outcome Date GA Lbr Len/2nd Weight Sex Delivery Anes PTL Lv  3 Para           2 Para           1 Para             Past medical history,surgical history, problem list, medications, allergies, family history and social history were all reviewed and documented in the EPIC chart.   Directed ROS with pertinent positives and negatives documented in the history of present illness/assessment and plan.  Exam:  There were no vitals filed for this visit. General appearance:  Normal  Abdomen: Normal  Gynecologic exam: Vulva normal.  Bimanual exam in laying down position:  Uterus AV, normal volume, mobile, NT.  No adnexal mass.  In standing position with valsalva: Complete uterine prolapse and Cystocele grade 4/4.  No Rectocele.    Assessment/Plan:  54 y.o. G3P3   1. Complete uterine prolapse with prolapse of anterior vaginal wall Very symptomatic complete uterine prolapse and cystocele grade 4/4.  Attempted pessary without success in the past.  Desires surgical correction.  We will proceed with XI robotic supracervical hysterectomy with bilateral  salpingectomy, sacrocolpopexy with Y mesh.  Possible anterior repair.  The decision to preserve or remove the ovaries thoroughly reviewed with patient.  Decision to preserve her ovaries at patient's preference and in concordance with recommendations.  Preop preparation, surgical procedure with risks and benefits as well as postop expectations and precautions thoroughly reviewed.  Patient voiced understanding and agreement with plan.  Surgery scheduled for December 30, 2019.  Pamphlet already received and read.  Patient's kidney function was borderline recently, will be reassessed with labs on December 25, 2019.                         Patient was counseled as to the risk of surgery to include the following:  1. Infection (prohylactic antibiotics will be administered)  2. DVT/Pulmonary Embolism (prophylactic pneumo compression stockings will be used)  3.Trauma to internal organs requiring additional surgical procedure to repair any injury to internal organs requiring perhaps additional hospitalization days.  4.Hemmorhage requiring transfusion and blood products which carry risks such as anaphylactic reaction, hepatitis and AIDS  Patient had received literature information on the procedure scheduled and all her questions were answered and fully accepts all risk.    [name] MD, 1:39 PM 12/09/2019

## 2019-12-18 ENCOUNTER — Encounter (HOSPITAL_COMMUNITY): Payer: Self-pay

## 2019-12-18 NOTE — Patient Instructions (Signed)
DUE TO COVID-19 ONLY ONE VISITOR IS ALLOWED IN WAITING ROOM (VISITOR WILL HAVE A TEMPERATURE CHECK ON ARRIVAL AND MUST WEAR A FACE MASK THE ENTIRE TIME.)  ONCE YOU ARE ADMITTED TO YOUR PRIVATE ROOM, THE SAME ONE VISITOR IS ALLOWED TO VISIT DURING VISITING HOURS ONLY.  Your COVID swab testing is scheduled for Friday, Aug. 6, 2021  at 1PM , You must self quarantine after your testing per handout given to you at the testing site. [address] [place], [place] [address]  (Must self quarantine after testing. Follow instructions on handout.)  Your procedure is scheduled on:  Tuesday, Aug. 10, 2021  Report to [facility]   Call this number if you have problems the morning of surgery:  [phone].   OUR ADDRESS IS [address].  WE ARE LOCATED IN THE [place]                                   [facility].                                     REMEMBER:  DO NOT EAT FOOD AFTER MIDNIGHT .    MAY HAVE LIQUIDS UNTIL 5:30 AM DAY OF SURGERY  CLEAR LIQUID DIET  Foods Allowed                                                                     Foods Excluded  Water, Black Coffee and tea, regular and decaf                             liquids that you cannot  Plain Jell-O in any flavor  (No red)                                           see through such as: Fruit ices (not with fruit pulp)                                     milk, soups, orange juice  Iced Popsicles (No red)                                    All solid food                                   Apple juices Sports drinks like Gatorade (No red) Lightly seasoned clear broth or consume(fat free) Sugar, honey syrup  Sample Menu Breakfast                                Lunch  Supper Cranberry juice                    Beef broth                            Chicken broth Jell-O                                     Grape juice                           Apple juice Coffee  or tea                        Jell-O                                      Popsicle                                                Coffee or tea                        Coffee or tea  BRUSH YOUR TEETH THE MORNING OF SURGERY.  TAKE THESE MEDICATIONS MORNING OF SURGERY WITH A SIP OF WATER:  BUPROPION, LEVOTHYROXINE, METOPROLOL  DO NOT WEAR JEWERLY, MAKE UP, OR NAIL POLISH.  DO NOT WEAR LOTIONS, POWDERS, PERFUMES/COLOGNE OR DEODORANT.  DO NOT SHAVE FOR 24 HOURS PRIOR TO DAY OF SURGERY.  CONTACTS, GLASSES, OR DENTURES MAY NOT BE WORN TO SURGERY.                                    [facility] IS NOT RESPONSIBLE  FOR ANY BELONGINGS.                                                                     [facility] - Preparing for Surgery Before surgery, you can play an important role.  Because skin is not sterile, your skin needs to be as free of germs as possible.  You can reduce the number of germs on your skin by washing with CHG (chlorahexidine gluconate) soap before surgery.  CHG is an antiseptic cleaner which kills germs and bonds with the skin to continue killing germs even after washing. Please DO NOT use if you have an allergy to CHG or antibacterial soaps.  If your skin becomes reddened/irritated stop using the CHG and inform your nurse when you arrive at Short Stay. Do not shave (including legs and underarms) for at least 48 hours prior to the first CHG shower.  You may shave your face/neck.  Please follow these instructions carefully:  1.  Shower with CHG Soap the night before surgery and the  morning of surgery.  2.  If you choose to  wash your hair, wash your hair first as usual with your normal  shampoo.  3.  After you shampoo, rinse your hair and body thoroughly to remove the shampoo.                             4.  Use CHG as you would any other liquid soap.  You can apply chg directly to the skin and wash.  Gently with a scrungie or clean washcloth.  5.  Apply the CHG Soap to your  body ONLY FROM THE NECK DOWN.   Do   not use on face/ open                           Wound or open sores. Avoid contact with eyes, ears mouth and   genitals (private parts).                       Wash face,  Genitals (private parts) with your normal soap.             6.  Wash thoroughly, paying special attention to the area where your    surgery  will be performed.  7.  Thoroughly rinse your body with warm water from the neck down.  8.  DO NOT shower/wash with your normal soap after using and rinsing off the CHG Soap.                9.  Pat yourself dry with a clean towel.            10.  Wear clean pajamas.            11.  Place clean sheets on your bed the night of your first shower and do not  sleep with pets. Day of Surgery : Do not apply any lotions/deodorants the morning of surgery.  Please wear clean clothes to the hospital/surgery center.  FAILURE TO FOLLOW THESE INSTRUCTIONS MAY RESULT IN THE CANCELLATION OF YOUR SURGERY  PATIENT SIGNATURE_________________________________  NURSE SIGNATURE__________________________________  ________________________________________________________________________   Adam Phenix  An incentive spirometer is a tool that can help keep your lungs clear and active. This tool measures how well you are filling your lungs with each breath. Taking long deep breaths may help reverse or decrease the chance of developing breathing (pulmonary) problems (especially infection) following:  A long period of time when you are unable to move or be active. BEFORE THE PROCEDURE   If the spirometer includes an indicator to show your best effort, your nurse or respiratory therapist will set it to a desired goal.  If possible, sit up straight or lean slightly forward. Try not to slouch.  Hold the incentive spirometer in an upright position. INSTRUCTIONS FOR USE  1. Sit on the edge of your bed if possible, or sit up as far as you can in bed or on a  chair. 2. Hold the incentive spirometer in an upright position. 3. Breathe out normally. 4. Place the mouthpiece in your mouth and seal your lips tightly around it. 5. Breathe in slowly and as deeply as possible, raising the piston or the ball toward the top of the column. 6. Hold your breath for 3-5 seconds or for as long as possible. Allow the piston or ball to fall to the bottom of the column. 7. Remove the mouthpiece from your mouth and breathe out normally.  8. Rest for a few seconds and repeat Steps 1 through 7 at least 10 times every 1-2 hours when you are awake. Take your time and take a few normal breaths between deep breaths. 9. The spirometer may include an indicator to show your best effort. Use the indicator as a goal to work toward during each repetition. 10. After each set of 10 deep breaths, practice coughing to be sure your lungs are clear. If you have an incision (the cut made at the time of surgery), support your incision when coughing by placing a pillow or rolled up towels firmly against it. Once you are able to get out of bed, walk around indoors and cough well. You may stop using the incentive spirometer when instructed by your caregiver.  RISKS AND COMPLICATIONS  Take your time so you do not get dizzy or light-headed.  If you are in pain, you may need to take or ask for pain medication before doing incentive spirometry. It is harder to take a deep breath if you are having pain. AFTER USE  Rest and breathe slowly and easily.  It can be helpful to keep track of a log of your progress. Your caregiver can provide you with a simple table to help with this. If you are using the spirometer at home, follow these instructions: [place] IF:   You are having difficultly using the spirometer.  You have trouble using the spirometer as often as instructed.  Your pain medication is not giving enough relief while using the spirometer.  You develop fever of 100.5 F  (38.1 C) or higher. SEEK IMMEDIATE MEDICAL CARE IF:   You cough up bloody sputum that had not been present before.  You develop fever of 102 F (38.9 C) or greater.  You develop worsening pain at or near the incision site. MAKE SURE YOU:   Understand these instructions.  Will watch your condition.  Will get help right away if you are not doing well or get worse. Document Released: 09/18/2006 Document Revised: 07/31/2011 Document Reviewed: 11/19/2006 Exit[employer] Patient Information 2014 [employer], [place].   ________________________________________________________________________  WHAT IS A BLOOD TRANSFUSION? Blood Transfusion Information  A transfusion is the replacement of blood or some of its parts. Blood is made up of multiple cells which provide different functions.  Red blood cells carry oxygen and are used for blood loss replacement.  White blood cells fight against infection.  Platelets control bleeding.  Plasma helps clot blood.  Other blood products are available for specialized needs, such as hemophilia or other clotting disorders. BEFORE THE TRANSFUSION  Who gives blood for transfusions?   Healthy volunteers who are fully evaluated to make sure their blood is safe. This is blood bank blood. Transfusion therapy is the safest it has ever been in the practice of medicine. Before blood is taken from a donor, a complete history is taken to make sure that person has no history of diseases nor engages in risky social behavior (examples are intravenous drug use or sexual activity with multiple partners). The donor's travel history is screened to minimize risk of transmitting infections, such as malaria. The donated blood is tested for signs of infectious diseases, such as HIV and hepatitis. The blood is then tested to be sure it is compatible with you in order to minimize the chance of a transfusion reaction. If you or a relative donates blood, this is often done in anticipation  of surgery and is not appropriate for emergency situations. It  takes many days to process the donated blood. RISKS AND COMPLICATIONS Although transfusion therapy is very safe and saves many lives, the main dangers of transfusion include:   Getting an infectious disease.  Developing a transfusion reaction. This is an allergic reaction to something in the blood you were given. Every precaution is taken to prevent this. The decision to have a blood transfusion has been considered carefully by your caregiver before blood is given. Blood is not given unless the benefits outweigh the risks. AFTER THE TRANSFUSION  Right after receiving a blood transfusion, you will usually feel much better and more energetic. This is especially true if your red blood cells have gotten low (anemic). The transfusion raises the level of the red blood cells which carry oxygen, and this usually causes an energy increase.  The nurse administering the transfusion will monitor you carefully for complications. HOME CARE INSTRUCTIONS  No special instructions are needed after a transfusion. You may find your energy is better. Speak with your caregiver about any limitations on activity for underlying diseases you may have. SEEK MEDICAL CARE IF:   Your condition is not improving after your transfusion.  You develop redness or irritation at the intravenous (IV) site. SEEK IMMEDIATE MEDICAL CARE IF:  Any of the following symptoms occur over the next 12 hours:  Shaking chills.  You have a temperature by mouth above 102 F (38.9 C), not controlled by medicine.  Chest, back, or muscle pain.  People around you feel you are not acting correctly or are confused.  Shortness of breath or difficulty breathing.  Dizziness and fainting.  You get a rash or develop hives.  You have a decrease in urine output.  Your urine turns a dark color or changes to pink, red, or brown. Any of the following symptoms occur over the next 10  days:  You have a temperature by mouth above 102 F (38.9 C), not controlled by medicine.  Shortness of breath.  Weakness after normal activity.  The white part of the eye turns yellow (jaundice).  You have a decrease in the amount of urine or are urinating less often.  Your urine turns a dark color or changes to pink, red, or brown. Document Released: 05/05/2000 Document Revised: 07/31/2011 Document Reviewed: 12/23/2007 [facility] Patient Information 2014 [place], [place].  _______________________________________________________________________

## 2019-12-18 NOTE — Progress Notes (Signed)
COVID Vaccine Completed: Date COVID Vaccine completed: COVID vaccine manufacturer: [place]   PCP - R. Baity NP last office visit 09/11/2019 in epic Cardiologist -   Chest x-ray -  EKG - 09/10/19 in epic Stress Test -  ECHO -  Cardiac Cath -   Sleep Study -  CPAP -   Fasting Blood Sugar -  Checks Blood Sugar _____ times a day  Blood Thinner Instructions: Aspirin Instructions: Last Dose:  Anesthesia review:   Patient denies shortness of breath, fever, cough and chest pain at PAT appointment   Patient verbalized understanding of instructions that were given to them at the PAT appointment. Patient was also instructed that they will need to review over the PAT instructions again at home before surgery.

## 2019-12-22 NOTE — Progress Notes (Addendum)
COVID Vaccine Completed: x2 Date COVID Vaccine completed:  08-01-19 & 08-19-19 COVID vaccine manufacturer: [employer]   PCP - Webb Silversmith, NP Cardiologist - Dr. Brigitte Pulse at [facility] r/o cardiac issue due to palpitations and fainting episodes.  Found to be related to hypothyroidism.  Chest x-ray - 09-09-19 in Epic EKG - 09-10-19 in Epic Stress Test - N/A ECHO -  N/A  Cardiac Cath -  N/A   Sleep Study -  N/A  CPAP -  N/A   Fasting Blood Sugar -  N/A  Checks Blood Sugar _____ times a day  Blood Thinner Instructions: N/A  Aspirin Instructions: N/A  Last Dose:  Anesthesia review:   Patient denies shortness of breath, fever, cough and chest pain at PAT appointment   Patient verbalized understanding of instructions that were given to them at the PAT appointment. Patient was also instructed that they will need to review over the PAT instructions again at home before surgery.

## 2019-12-22 NOTE — Patient Instructions (Addendum)
DUE TO COVID-19 ONLY ONE VISITOR IS ALLOWED IN WAITING ROOM (VISITOR WILL HAVE A TEMPERATURE CHECK ON ARRIVAL AND MUST WEAR A FACE MASK THE ENTIRE TIME.)  ONCE YOU ARE ADMITTED TO YOUR PRIVATE ROOM, THE SAME ONE VISITOR IS ALLOWED TO VISIT DURING VISITING HOURS ONLY.  Your COVID swab testing is scheduled for Friday 12-26-19 at 1 PM , You must self quarantine after your testing per handout given to you at the testing site. [address] [place], [place] [address]   Your procedure is scheduled on:  Tuesday, 12-30-19  Report to [facility].   Call this number if you have problems the morning of surgery:[phone].   OUR ADDRESS IS [address].  WE ARE LOCATED IN [facility].                                     REMEMBER: DO NOT EAT FOOD OR DRINK LIQUIDS AFTER MIDNIGHT.   BRUSH YOUR TEETH THE MORNING OF SURGERY.  TAKE THESE MEDICATIONS MORNING OF SURGERY WITH A SIP OF WATER:  Bupropion (Wellbutrin), Levothyroxine (Synthroid), Metoprolol (Lopressor)  DO NOT WEAR JEWERLY, MAKE UP, OR NAIL POLISH.  DO NOT WEAR LOTIONS, POWDERS, PERFUMES/COLOGNE OR DEODORANT.  DO NOT SHAVE FOR 24 HOURS PRIOR TO DAY OF SURGERY.  CONTACTS, GLASSES, OR DENTURES MAY NOT BE WORN TO SURGERY.                                    [facility] IS NOT RESPONSIBLE  FOR ANY BELONGINGS.  MAY BRING SMALL OVERNIGHT BAG DAY OF SURGERY                                   Please read over the following fact sheets you were given: IF YOU HAVE QUESTIONS ABOUT YOUR PRE OP INSTRUCTIONS PLEASE CALL [phone]   [facility] - Preparing for Surgery Before surgery, you can play an important role.  Because skin is not sterile, your skin needs to be as free of germs as possible.  You can reduce the number of germs on your skin by washing with CHG (chlorahexidine gluconate) soap before surgery.  CHG is an antiseptic cleaner which kills germs and bonds with the skin to continue  killing germs even after washing. Please DO NOT use if you have an allergy to CHG or antibacterial soaps.  If your skin becomes reddened/irritated stop using the CHG and inform your nurse when you arrive at Short Stay. Do not shave (including legs and underarms) for at least 48 hours prior to the first CHG shower.  You may shave your face/neck.  Please follow these instructions carefully:  1.  Shower with CHG Soap the night before surgery and the  morning of surgery.  2.  If you choose to wash your hair, wash your hair first as usual with your normal  shampoo.  3.  After you shampoo, rinse your hair and body thoroughly to remove the shampoo.                             4.  Use CHG as you would any other liquid soap.  You can apply  chg directly to the skin and wash.  Gently with a scrungie or clean washcloth.  5.  Apply the CHG Soap to your body ONLY FROM THE NECK DOWN.   Do   not use on face/ open                           Wound or open sores. Avoid contact with eyes, ears mouth and   genitals (private parts).                       Wash face,  Genitals (private parts) with your normal soap.             6.  Wash thoroughly, paying special attention to the area where your    surgery  will be performed.  7.  Thoroughly rinse your body with warm water from the neck down.  8.  DO NOT shower/wash with your normal soap after using and rinsing off the CHG Soap.                9.  Pat yourself dry with a clean towel.            10.  Wear clean pajamas.            11.  Place clean sheets on your bed the night of your first shower and do not  sleep with pets. Day of Surgery : Do not apply any lotions/deodorants the morning of surgery.  Please wear clean clothes to the hospital/surgery center.  FAILURE TO FOLLOW THESE INSTRUCTIONS MAY RESULT IN THE CANCELLATION OF YOUR SURGERY  PATIENT SIGNATURE_________________________________  NURSE  SIGNATURE__________________________________  ________________________________________________________________________   Adam Phenix  An incentive spirometer is a tool that can help keep your lungs clear and active. This tool measures how well you are filling your lungs with each breath. Taking long deep breaths may help reverse or decrease the chance of developing breathing (pulmonary) problems (especially infection) following:  A long period of time when you are unable to move or be active. BEFORE THE PROCEDURE   If the spirometer includes an indicator to show your best effort, your nurse or respiratory therapist will set it to a desired goal.  If possible, sit up straight or lean slightly forward. Try not to slouch.  Hold the incentive spirometer in an upright position. INSTRUCTIONS FOR USE  1. Sit on the edge of your bed if possible, or sit up as far as you can in bed or on a chair. 2. Hold the incentive spirometer in an upright position. 3. Breathe out normally. 4. Place the mouthpiece in your mouth and seal your lips tightly around it. 5. Breathe in slowly and as deeply as possible, raising the piston or the ball toward the top of the column. 6. Hold your breath for 3-5 seconds or for as long as possible. Allow the piston or ball to fall to the bottom of the column. 7. Remove the mouthpiece from your mouth and breathe out normally. 8. Rest for a few seconds and repeat Steps 1 through 7 at least 10 times every 1-2 hours when you are awake. Take your time and take a few normal breaths between deep breaths. 9. The spirometer may include an indicator to show your best effort. Use the indicator as a goal to work toward during each repetition. 10. After each set of 10 deep breaths, practice coughing to be sure your lungs  are clear. If you have an incision (the cut made at the time of surgery), support your incision when coughing by placing a pillow or rolled up towels firmly  against it. Once you are able to get out of bed, walk around indoors and cough well. You may stop using the incentive spirometer when instructed by your caregiver.  RISKS AND COMPLICATIONS  Take your time so you do not get dizzy or light-headed.  If you are in pain, you may need to take or ask for pain medication before doing incentive spirometry. It is harder to take a deep breath if you are having pain. AFTER USE  Rest and breathe slowly and easily.  It can be helpful to keep track of a log of your progress. Your caregiver can provide you with a simple table to help with this. If you are using the spirometer at home, follow these instructions: Rachel IF:   You are having difficultly using the spirometer.  You have trouble using the spirometer as often as instructed.  Your pain medication is not giving enough relief while using the spirometer.  You develop fever of 100.5 F (38.1 C) or higher. SEEK IMMEDIATE MEDICAL CARE IF:   You cough up bloody sputum that had not been present before.  You develop fever of 102 F (38.9 C) or greater.  You develop worsening pain at or near the incision site. MAKE SURE YOU:   Understand these instructions.  Will watch your condition.  Will get help right away if you are not doing well or get worse. Document Released: 09/18/2006 Document Revised: 07/31/2011 Document Reviewed: 11/19/2006 Exit[employer] Patient Information 2014 Exit[employer], [place].   ________________________________________________________________________  WHAT IS A BLOOD TRANSFUSION? Blood Transfusion Information  A transfusion is the replacement of blood or some of its parts. Blood is made up of multiple cells which provide different functions.  Red blood cells carry oxygen and are used for blood loss replacement.  White blood cells fight against infection.  Platelets control bleeding.  Plasma helps clot blood.  Other blood products are available for  specialized needs, such as hemophilia or other clotting disorders. BEFORE THE TRANSFUSION  Who gives blood for transfusions?   Healthy volunteers who are fully evaluated to make sure their blood is safe. This is blood bank blood. Transfusion therapy is the safest it has ever been in the practice of medicine. Before blood is taken from a donor, a complete history is taken to make sure that person has no history of diseases nor engages in risky social behavior (examples are intravenous drug use or sexual activity with multiple partners). The donor's travel history is screened to minimize risk of transmitting infections, such as malaria. The donated blood is tested for signs of infectious diseases, such as HIV and hepatitis. The blood is then tested to be sure it is compatible with you in order to minimize the chance of a transfusion reaction. If you or a relative donates blood, this is often done in anticipation of surgery and is not appropriate for emergency situations. It takes many days to process the donated blood. RISKS AND COMPLICATIONS Although transfusion therapy is very safe and saves many lives, the main dangers of transfusion include:   Getting an infectious disease.  Developing a transfusion reaction. This is an allergic reaction to something in the blood you were given. Every precaution is taken to prevent this. The decision to have a blood transfusion has been considered carefully by your caregiver before blood  is given. Blood is not given unless the benefits outweigh the risks. AFTER THE TRANSFUSION  Right after receiving a blood transfusion, you will usually feel much better and more energetic. This is especially true if your red blood cells have gotten low (anemic). The transfusion raises the level of the red blood cells which carry oxygen, and this usually causes an energy increase.  The nurse administering the transfusion will monitor you carefully for complications. HOME CARE  INSTRUCTIONS  No special instructions are needed after a transfusion. You may find your energy is better. Speak with your caregiver about any limitations on activity for underlying diseases you may have. SEEK MEDICAL CARE IF:   Your condition is not improving after your transfusion.  You develop redness or irritation at the intravenous (IV) site. SEEK IMMEDIATE MEDICAL CARE IF:  Any of the following symptoms occur over the next 12 hours:  Shaking chills.  You have a temperature by mouth above 102 F (38.9 C), not controlled by medicine.  Chest, back, or muscle pain.  People around you feel you are not acting correctly or are confused.  Shortness of breath or difficulty breathing.  Dizziness and fainting.  You get a rash or develop hives.  You have a decrease in urine output.  Your urine turns a dark color or changes to pink, red, or brown. Any of the following symptoms occur over the next 10 days:  You have a temperature by mouth above 102 F (38.9 C), not controlled by medicine.  Shortness of breath.  Weakness after normal activity.  The white part of the eye turns yellow (jaundice).  You have a decrease in the amount of urine or are urinating less often.  Your urine turns a dark color or changes to pink, red, or brown. Document Released: 05/05/2000 Document Revised: 07/31/2011 Document Reviewed: 12/23/2007 [facility] Patient Information 2014 [place], [place].  _______________________________________________________________________

## 2019-12-25 ENCOUNTER — Encounter (HOSPITAL_COMMUNITY)
Admission: RE | Admit: 2019-12-25 | Discharge: 2019-12-25 | Disposition: A | Discharge status: Home / Self Care | Payer: POS | Source: Ambulatory Visit | Attending: Internal Medicine | Admitting: Internal Medicine

## 2019-12-25 HISTORY — DX: Gastro-esophageal reflux disease without esophagitis: K21.9

## 2019-12-25 HISTORY — DX: Personal history of gestational diabetes: Z86.32

## 2019-12-25 HISTORY — DX: Carpal tunnel syndrome, bilateral upper limbs: G56.03

## 2019-12-26 ENCOUNTER — Other Ambulatory Visit (HOSPITAL_COMMUNITY): Payer: POS

## 2019-12-26 ENCOUNTER — Other Ambulatory Visit: Payer: Self-pay

## 2019-12-26 ENCOUNTER — Other Ambulatory Visit
Admission: RE | Admit: 2019-12-26 | Discharge: 2019-12-26 | Disposition: A | Discharge status: Home / Self Care | Payer: POS | Source: Ambulatory Visit | Attending: Obstetrics & Gynecology | Admitting: Obstetrics & Gynecology

## 2019-12-26 DIAGNOSIS — Z01812 Encounter for preprocedural laboratory examination: Secondary | ICD-10-CM | POA: Diagnosis not present

## 2019-12-26 DIAGNOSIS — Z20822 Contact with and (suspected) exposure to covid-19: Secondary | ICD-10-CM | POA: Insufficient documentation

## 2019-12-27 LAB — SARS CORONAVIRUS 2 (TAT 6-24 HRS): SARS Coronavirus 2: NEGATIVE

## 2019-12-29 ENCOUNTER — Encounter (HOSPITAL_COMMUNITY): Payer: Self-pay

## 2019-12-29 ENCOUNTER — Other Ambulatory Visit: Payer: Self-pay | Admitting: Internal Medicine

## 2019-12-29 ENCOUNTER — Other Ambulatory Visit: Payer: Self-pay

## 2019-12-29 ENCOUNTER — Encounter (HOSPITAL_COMMUNITY)
Admission: RE | Admit: 2019-12-29 | Discharge: 2019-12-29 | Disposition: A | Discharge status: Home / Self Care | Payer: POS | Source: Ambulatory Visit | Attending: Obstetrics & Gynecology | Admitting: Obstetrics & Gynecology

## 2019-12-29 DIAGNOSIS — Z01812 Encounter for preprocedural laboratory examination: Secondary | ICD-10-CM | POA: Insufficient documentation

## 2019-12-29 HISTORY — DX: Malignant (primary) neoplasm, unspecified: C80.1

## 2019-12-29 HISTORY — DX: Essential (primary) hypertension: I10

## 2019-12-29 LAB — BASIC METABOLIC PANEL
Anion gap: 8 (ref 5–15)
BUN: 16 mg/dL (ref 6–20)
CO2: 24 mmol/L (ref 22–32)
Calcium: 9 mg/dL (ref 8.9–10.3)
Chloride: 103 mmol/L (ref 98–111)
Creatinine, Ser: 0.8 mg/dL (ref 0.44–1.00)
GFR calc Af Amer: >60 mL/min (ref >60)
GFR calc non Af Amer: >60 mL/min (ref >60)
Glucose, Bld: 111 mg/dL — ABNORMAL HIGH (ref 70–99)
Potassium: 4.3 mmol/L (ref 3.5–5.1)
Sodium: 135 mmol/L (ref 135–145)

## 2019-12-29 LAB — CBC
HCT: 43.5 % (ref 36.0–46.0)
Hemoglobin: 13.8 g/dL (ref 12.0–15.0)
MCH: 31.2 pg (ref 26.0–34.0)
MCHC: 31.7 g/dL (ref 30.0–36.0)
MCV: 98.2 fL (ref 80.0–100.0)
Platelets: 218 10*3/uL (ref 150–400)
RBC: 4.43 MIL/uL (ref 3.87–5.11)
RDW: 12.8 % (ref 11.5–15.5)
WBC: 6.5 10*3/uL (ref 4.0–10.5)
nRBC: 0 % (ref 0.0–0.2)

## 2019-12-29 NOTE — Anesthesia Preprocedure Evaluation (Addendum)
Anesthesia Evaluation  Patient identified by MRN, date of birth, ID band Patient awake    Reviewed: Allergy & Precautions, NPO status , Patient's Chart, lab work & pertinent test results  History of Anesthesia Complications Negative for: history of anesthetic complications  Airway Mallampati: II  TM Distance: >3 FB Neck ROM: Full    Dental  (+)    Pulmonary neg pulmonary ROS,    Pulmonary exam normal        Cardiovascular hypertension, Pt. on home beta blockers and Pt. on medications Normal cardiovascular exam     Neuro/Psych  Headaches, Anxiety Depression    GI/Hepatic Neg liver ROS, GERD  Medicated and Controlled,  Endo/Other  Hypothyroidism   Renal/GU negative Renal ROS  negative genitourinary   Musculoskeletal negative musculoskeletal ROS (+)   Abdominal   Peds  Hematology negative hematology ROS (+)   Anesthesia Other Findings Day of surgery medications reviewed with patient.  Reproductive/Obstetrics complete uterine prolapse wtih prolapse of anterior vaginal wall                            Anesthesia Physical Anesthesia Plan  ASA: II  Anesthesia Plan: General   Post-op Pain Management:    Induction: Intravenous  PONV Risk Score and Plan: 4 or greater and Treatment may vary due to age or medical condition, Midazolam, Ondansetron, Dexamethasone and Scopolamine patch - Pre-op  Airway Management Planned: Oral ETT  Additional Equipment: None  Intra-op Plan:   Post-operative Plan: Extubation in OR  Informed Consent: I have reviewed the patients History and Physical, chart, labs and discussed the procedure including the risks, benefits and alternatives for the proposed anesthesia with the patient or authorized representative who has indicated his/her understanding and acceptance.     Dental advisory given  Plan Discussed with: CRNA  Anesthesia Plan Comments:         Anesthesia Quick Evaluation

## 2019-12-30 ENCOUNTER — Ambulatory Visit (HOSPITAL_BASED_OUTPATIENT_CLINIC_OR_DEPARTMENT_OTHER): Payer: POS | Admitting: Anesthesiology

## 2019-12-30 ENCOUNTER — Encounter (HOSPITAL_BASED_OUTPATIENT_CLINIC_OR_DEPARTMENT_OTHER): Payer: Self-pay | Admitting: Obstetrics & Gynecology

## 2019-12-30 ENCOUNTER — Encounter (HOSPITAL_BASED_OUTPATIENT_CLINIC_OR_DEPARTMENT_OTHER)
Admission: RE | Disposition: A | Discharge status: Home / Self Care | Payer: Self-pay | Source: Home / Self Care | Attending: Obstetrics & Gynecology

## 2019-12-30 ENCOUNTER — Other Ambulatory Visit: Payer: Self-pay

## 2019-12-30 ENCOUNTER — Ambulatory Visit (HOSPITAL_BASED_OUTPATIENT_CLINIC_OR_DEPARTMENT_OTHER)
Admission: RE | Admit: 2019-12-30 | Discharge: 2019-12-31 | Disposition: A | Discharge status: Home / Self Care | Payer: POS | Attending: Obstetrics & Gynecology | Admitting: Obstetrics & Gynecology

## 2019-12-30 DIAGNOSIS — Z0289 Encounter for other administrative examinations: Secondary | ICD-10-CM

## 2019-12-30 DIAGNOSIS — E039 Hypothyroidism, unspecified: Secondary | ICD-10-CM | POA: Diagnosis not present

## 2019-12-30 DIAGNOSIS — N814 Uterovaginal prolapse, unspecified: Secondary | ICD-10-CM | POA: Insufficient documentation

## 2019-12-30 DIAGNOSIS — F329 Major depressive disorder, single episode, unspecified: Secondary | ICD-10-CM | POA: Diagnosis not present

## 2019-12-30 DIAGNOSIS — N736 Female pelvic peritoneal adhesions (postinfective): Secondary | ICD-10-CM

## 2019-12-30 DIAGNOSIS — Z881 Allergy status to other antibiotic agents status: Secondary | ICD-10-CM | POA: Insufficient documentation

## 2019-12-30 DIAGNOSIS — Z9889 Other specified postprocedural states: Secondary | ICD-10-CM

## 2019-12-30 DIAGNOSIS — Z79890 Hormone replacement therapy (postmenopausal): Secondary | ICD-10-CM | POA: Insufficient documentation

## 2019-12-30 DIAGNOSIS — N813 Complete uterovaginal prolapse: Secondary | ICD-10-CM

## 2019-12-30 DIAGNOSIS — N838 Other noninflammatory disorders of ovary, fallopian tube and broad ligament: Secondary | ICD-10-CM

## 2019-12-30 DIAGNOSIS — Z79899 Other long term (current) drug therapy: Secondary | ICD-10-CM | POA: Diagnosis not present

## 2019-12-30 DIAGNOSIS — I10 Essential (primary) hypertension: Secondary | ICD-10-CM | POA: Diagnosis not present

## 2019-12-30 DIAGNOSIS — K589 Irritable bowel syndrome without diarrhea: Secondary | ICD-10-CM | POA: Diagnosis not present

## 2019-12-30 HISTORY — PX: ROBOTIC ASSISTED LAPAROSCOPIC SACROCOLPOPEXY: SHX5388

## 2019-12-30 HISTORY — PX: ROBOTIC ASSISTED SUPRACERVICAL HYSTERECTOMY WITH BILATERAL SALPINGO OOPHERECTOMY: SHX6084

## 2019-12-30 LAB — CBC
HCT: 41.3 % (ref 36.0–46.0)
Hemoglobin: 13.4 g/dL (ref 12.0–15.0)
MCH: 31.8 pg (ref 26.0–34.0)
MCHC: 32.4 g/dL (ref 30.0–36.0)
MCV: 97.9 fL (ref 80.0–100.0)
Platelets: 206 10*3/uL (ref 150–400)
RBC: 4.22 MIL/uL (ref 3.87–5.11)
RDW: 12.7 % (ref 11.5–15.5)
WBC: 10.8 10*3/uL — ABNORMAL HIGH (ref 4.0–10.5)
nRBC: 0 % (ref 0.0–0.2)

## 2019-12-30 LAB — TYPE AND SCREEN
ABO/RH(D): B POS
Antibody Screen: NEGATIVE

## 2019-12-30 LAB — POCT PREGNANCY, URINE: Preg Test, Ur: NEGATIVE

## 2019-12-30 LAB — ABO/RH: ABO/RH(D): B POS

## 2019-12-30 SURGERY — HYSTERECTOMY, SUPRACERVICAL, ROBOT-ASSISTED, LAPAROSCOPIC, WITH BILATERAL SALPINGECTOMY
Anesthesia: General | Site: Abdomen

## 2019-12-30 MED ORDER — EPHEDRINE 5 MG/ML INJ
INTRAVENOUS | Status: AC
  Filled 2019-12-30: qty 10

## 2019-12-30 MED ORDER — BUPIVACAINE HCL (PF) 0.25 % IJ SOLN
INTRAMUSCULAR | Status: DC | PRN
  Administered 2019-12-30: 12 mL

## 2019-12-30 MED ORDER — PROMETHAZINE HCL 25 MG/ML IJ SOLN: 6.2500 mg | INTRAMUSCULAR | Status: DC | PRN

## 2019-12-30 MED ORDER — KETOROLAC TROMETHAMINE 30 MG/ML IJ SOLN
INTRAMUSCULAR | Status: DC | PRN
  Administered 2019-12-30: 30 mg via INTRAVENOUS

## 2019-12-30 MED ORDER — HYDROCODONE-ACETAMINOPHEN 5-325 MG PO TABS: 1.0000 | ORAL_TABLET | ORAL | Status: DC | PRN

## 2019-12-30 MED ORDER — PROPOFOL 10 MG/ML IV BOLUS
INTRAVENOUS | Status: AC
  Filled 2019-12-30: qty 40

## 2019-12-30 MED ORDER — ROCURONIUM BROMIDE 10 MG/ML (PF) SYRINGE
PREFILLED_SYRINGE | INTRAVENOUS | Status: DC | PRN
  Administered 2019-12-30: 40 mg via INTRAVENOUS
  Administered 2019-12-30 (×2): 20 mg via INTRAVENOUS

## 2019-12-30 MED ORDER — SUGAMMADEX SODIUM 200 MG/2ML IV SOLN
INTRAVENOUS | Status: DC | PRN
Start: 2019-12-30 — End: 2019-12-30
  Administered 2019-12-30: 120 mg via INTRAVENOUS

## 2019-12-30 MED ORDER — LACTATED RINGERS IV SOLN: INTRAVENOUS | Status: DC

## 2019-12-30 MED ORDER — GLYCOPYRROLATE 0.2 MG/ML IJ SOLN
INTRAMUSCULAR | Status: DC | PRN
  Administered 2019-12-30: .2 mg via INTRAVENOUS

## 2019-12-30 MED ORDER — OXYCODONE-ACETAMINOPHEN 5-325 MG PO TABS
ORAL_TABLET | ORAL | Status: AC
  Filled 2019-12-30: qty 2

## 2019-12-30 MED ORDER — DEXAMETHASONE SODIUM PHOSPHATE 10 MG/ML IJ SOLN
INTRAMUSCULAR | Status: AC
  Filled 2019-12-30: qty 1

## 2019-12-30 MED ORDER — SCOPOLAMINE 1 MG/3DAYS TD PT72
1.0000 | MEDICATED_PATCH | Freq: Once | TRANSDERMAL | Status: DC
  Administered 2019-12-30: 1.5 mg via TRANSDERMAL

## 2019-12-30 MED ORDER — GENTAMICIN SULFATE 40 MG/ML IJ SOLN
5.0000 mg/kg | INTRAVENOUS | Status: AC
  Administered 2019-12-30: 290.4 mg via INTRAVENOUS
  Filled 2019-12-30: qty 7.25

## 2019-12-30 MED ORDER — MIDAZOLAM HCL 5 MG/5ML IJ SOLN
INTRAMUSCULAR | Status: DC | PRN
  Administered 2019-12-30: 2 mg via INTRAVENOUS

## 2019-12-30 MED ORDER — LIDOCAINE 2% (20 MG/ML) 5 ML SYRINGE
INTRAMUSCULAR | Status: DC | PRN
  Administered 2019-12-30: 60 mg via INTRAVENOUS

## 2019-12-30 MED ORDER — ACETAMINOPHEN 500 MG PO TABS
ORAL_TABLET | ORAL | Status: AC
  Filled 2019-12-30: qty 2

## 2019-12-30 MED ORDER — OXYCODONE HCL 5 MG PO TABS: 5.0000 mg | ORAL_TABLET | Freq: Once | ORAL | Status: DC | PRN

## 2019-12-30 MED ORDER — DEXMEDETOMIDINE (PRECEDEX) IN NS 20 MCG/5ML (4 MCG/ML) IV SYRINGE
PREFILLED_SYRINGE | INTRAVENOUS | Status: AC
  Filled 2019-12-30: qty 5

## 2019-12-30 MED ORDER — DEXAMETHASONE SODIUM PHOSPHATE 10 MG/ML IJ SOLN
INTRAMUSCULAR | Status: DC | PRN
  Administered 2019-12-30: 10 mg via INTRAVENOUS

## 2019-12-30 MED ORDER — ACETAMINOPHEN 500 MG PO TABS
1000.0000 mg | ORAL_TABLET | Freq: Once | ORAL | Status: AC
  Administered 2019-12-30: 1000 mg via ORAL

## 2019-12-30 MED ORDER — ACETAMINOPHEN 325 MG PO TABS: 650.0000 mg | ORAL_TABLET | ORAL | Status: DC | PRN

## 2019-12-30 MED ORDER — METOPROLOL TARTRATE 25 MG PO TABS: 25.0000 mg | ORAL_TABLET | Freq: Two times a day (BID) | ORAL | Status: DC

## 2019-12-30 MED ORDER — OXYCODONE HCL 5 MG/5ML PO SOLN: 5.0000 mg | Freq: Once | ORAL | Status: DC | PRN

## 2019-12-30 MED ORDER — OXYCODONE-ACETAMINOPHEN 5-325 MG PO TABS
2.0000 | ORAL_TABLET | ORAL | Status: DC | PRN
  Administered 2019-12-30 – 2019-12-31 (×5): 2 via ORAL

## 2019-12-30 MED ORDER — FENTANYL CITRATE (PF) 100 MCG/2ML IJ SOLN
INTRAMUSCULAR | Status: AC
  Filled 2019-12-30: qty 2

## 2019-12-30 MED ORDER — DEXMEDETOMIDINE (PRECEDEX) IN NS 20 MCG/5ML (4 MCG/ML) IV SYRINGE
PREFILLED_SYRINGE | INTRAVENOUS | Status: DC | PRN
  Administered 2019-12-30 (×2): 8 ug via INTRAVENOUS

## 2019-12-30 MED ORDER — ONDANSETRON HCL 4 MG/2ML IJ SOLN
INTRAMUSCULAR | Status: AC
  Filled 2019-12-30: qty 2

## 2019-12-30 MED ORDER — EPHEDRINE SULFATE 50 MG/ML IJ SOLN
INTRAMUSCULAR | Status: DC | PRN
  Administered 2019-12-30 (×2): 15 mg via INTRAVENOUS

## 2019-12-30 MED ORDER — PROPOFOL 10 MG/ML IV BOLUS
INTRAVENOUS | Status: DC | PRN
  Administered 2019-12-30: 120 mg via INTRAVENOUS

## 2019-12-30 MED ORDER — LIDOCAINE 2% (20 MG/ML) 5 ML SYRINGE
INTRAMUSCULAR | Status: AC
  Filled 2019-12-30: qty 5

## 2019-12-30 MED ORDER — CLINDAMYCIN PHOSPHATE 900 MG/50ML IV SOLN
900.0000 mg | INTRAVENOUS | Status: AC
  Administered 2019-12-30: 900 mg via INTRAVENOUS

## 2019-12-30 MED ORDER — CLINDAMYCIN PHOSPHATE 900 MG/50ML IV SOLN
INTRAVENOUS | Status: AC
  Filled 2019-12-30: qty 50

## 2019-12-30 MED ORDER — SCOPOLAMINE 1 MG/3DAYS TD PT72
MEDICATED_PATCH | TRANSDERMAL | Status: AC
  Filled 2019-12-30: qty 1

## 2019-12-30 MED ORDER — GLYCOPYRROLATE PF 0.2 MG/ML IJ SOSY
PREFILLED_SYRINGE | INTRAMUSCULAR | Status: AC
  Filled 2019-12-30: qty 1

## 2019-12-30 MED ORDER — MIDAZOLAM HCL 2 MG/2ML IJ SOLN
INTRAMUSCULAR | Status: AC
  Filled 2019-12-30: qty 2

## 2019-12-30 MED ORDER — ROCURONIUM BROMIDE 10 MG/ML (PF) SYRINGE
PREFILLED_SYRINGE | INTRAVENOUS | Status: AC
  Filled 2019-12-30: qty 10

## 2019-12-30 MED ORDER — BUPROPION HCL ER (XL) 150 MG PO TB24
150.0000 mg | ORAL_TABLET | Freq: Every day | ORAL | Status: DC
  Filled 2019-12-30: qty 1

## 2019-12-30 MED ORDER — FENTANYL CITRATE (PF) 100 MCG/2ML IJ SOLN
25.0000 ug | INTRAMUSCULAR | Status: DC | PRN
  Administered 2019-12-30: 25 ug via INTRAVENOUS

## 2019-12-30 MED ORDER — FENTANYL CITRATE (PF) 100 MCG/2ML IJ SOLN
INTRAMUSCULAR | Status: DC | PRN
  Administered 2019-12-30 (×5): 50 ug via INTRAVENOUS

## 2019-12-30 MED ORDER — FENTANYL CITRATE (PF) 250 MCG/5ML IJ SOLN
INTRAMUSCULAR | Status: AC
  Filled 2019-12-30: qty 5

## 2019-12-30 MED ORDER — ONDANSETRON HCL 4 MG/2ML IJ SOLN
INTRAMUSCULAR | Status: DC | PRN
  Administered 2019-12-30: 4 mg via INTRAVENOUS

## 2019-12-30 MED ORDER — ARTIFICIAL TEARS OPHTHALMIC OINT
TOPICAL_OINTMENT | OPHTHALMIC | Status: AC
  Filled 2019-12-30: qty 3.5

## 2019-12-30 SURGICAL SUPPLY — 80 items
BAG RETRIEVAL 10 (BASKET) ×1
BAG RETRIEVAL 10MM (BASKET) ×1
BLADE SURG 10 STRL SS (BLADE) ×5 IMPLANT
BLADE SURG 15 STRL LF DISP TIS (BLADE) ×3 IMPLANT
BLADE SURG 15 STRL SS (BLADE) ×5
CATH FOLEY 3WAY  5CC 16FR (CATHETERS) ×5
CATH FOLEY 3WAY 5CC 16FR (CATHETERS) ×3 IMPLANT
COVER BACK TABLE 60X90IN (DRAPES) ×5 IMPLANT
COVER TIP SHEARS 8 DVNC (MISCELLANEOUS) ×3 IMPLANT
COVER TIP SHEARS 8MM DA VINCI (MISCELLANEOUS) ×5
COVER WAND RF STERILE (DRAPES) ×5 IMPLANT
DEFOGGER SCOPE WARMER CLEARIFY (MISCELLANEOUS) ×5 IMPLANT
DERMABOND ADVANCED (GAUZE/BANDAGES/DRESSINGS) ×2
DERMABOND ADVANCED .7 DNX12 (GAUZE/BANDAGES/DRESSINGS) ×3 IMPLANT
DRAPE ARM DVNC X/XI (DISPOSABLE) ×12 IMPLANT
DRAPE COLUMN DVNC XI (DISPOSABLE) ×3 IMPLANT
DRAPE DA VINCI XI ARM (DISPOSABLE) ×20
DRAPE DA VINCI XI COLUMN (DISPOSABLE) ×5
DRAPE UTILITY XL STRL (DRAPES) ×5 IMPLANT
DRSG OPSITE POSTOP 3X4 (GAUZE/BANDAGES/DRESSINGS) ×5 IMPLANT
DURAPREP 26ML APPLICATOR (WOUND CARE) ×5 IMPLANT
ELECT REM PT RETURN 9FT ADLT (ELECTROSURGICAL) ×5
ELECTRODE REM PT RTRN 9FT ADLT (ELECTROSURGICAL) ×3 IMPLANT
GAUZE 4X4 16PLY RFD (DISPOSABLE) ×5 IMPLANT
GAUZE PETROLATUM 1 X8 (GAUZE/BANDAGES/DRESSINGS) ×5 IMPLANT
GAUZE VASELINE 3X9 (GAUZE/BANDAGES/DRESSINGS) ×5 IMPLANT
GLOVE BIO SURGEON STRL SZ 6.5 (GLOVE) ×12 IMPLANT
GLOVE BIO SURGEON STRL SZ7 (GLOVE) ×15 IMPLANT
GLOVE BIO SURGEON STRL SZ8 (GLOVE) ×5 IMPLANT
GLOVE BIO SURGEONS STRL SZ 6.5 (GLOVE) ×3
GLOVE BIOGEL PI IND STRL 6.5 (GLOVE) ×3 IMPLANT
GLOVE BIOGEL PI IND STRL 7.0 (GLOVE) ×27 IMPLANT
GLOVE BIOGEL PI IND STRL 7.5 (GLOVE) ×6 IMPLANT
GLOVE BIOGEL PI IND STRL 8 (GLOVE) ×3 IMPLANT
GLOVE BIOGEL PI INDICATOR 6.5 (GLOVE) ×2
GLOVE BIOGEL PI INDICATOR 7.0 (GLOVE) ×18
GLOVE BIOGEL PI INDICATOR 7.5 (GLOVE) ×4
GLOVE BIOGEL PI INDICATOR 8 (GLOVE) ×2
GLOVE ECLIPSE 6.5 STRL STRAW (GLOVE) ×5 IMPLANT
GLOVE SURG SS PI 6.5 STRL IVOR (GLOVE) ×10 IMPLANT
GOWN STRL REUS W/TWL LRG LVL3 (GOWN DISPOSABLE) ×5 IMPLANT
GOWN STRL REUS W/TWL XL LVL3 (GOWN DISPOSABLE) ×5 IMPLANT
HOLDER FOLEY CATH W/STRAP (MISCELLANEOUS) ×5 IMPLANT
IRRIG SUCT STRYKERFLOW 2 WTIP (MISCELLANEOUS) ×5
IRRIGATION SUCT STRKRFLW 2 WTP (MISCELLANEOUS) ×3 IMPLANT
IV NS IRRIG 3000ML ARTHROMATIC (IV SOLUTION) ×5 IMPLANT
KIT TURNOVER CYSTO (KITS) ×5 IMPLANT
LEGGING LITHOTOMY PAIR STRL (DRAPES) ×5 IMPLANT
MANIFOLD NEPTUNE II (INSTRUMENTS) ×5 IMPLANT
MESH ARTISYN Y (Mesh General) ×5 IMPLANT
NEEDLE HYPO 22GX1.5 SAFETY (NEEDLE) ×5 IMPLANT
OBTURATOR OPTICAL STANDARD 8MM (TROCAR) ×5
OBTURATOR OPTICAL STND 8 DVNC (TROCAR) ×3
OBTURATOR OPTICALSTD 8 DVNC (TROCAR) ×3 IMPLANT
OCCLUDER COLPOPNEUMO (BALLOONS) ×5 IMPLANT
PACK ROBOT WH (CUSTOM PROCEDURE TRAY) ×5 IMPLANT
PACK ROBOTIC GOWN (GOWN DISPOSABLE) ×5 IMPLANT
PACK TRENDGUARD 450 HYBRID PRO (MISCELLANEOUS) ×3 IMPLANT
PACK VAGINAL WOMENS (CUSTOM PROCEDURE TRAY) ×5 IMPLANT
PAD PREP 24X48 CUFFED NSTRL (MISCELLANEOUS) ×5 IMPLANT
PROTECTOR NERVE ULNAR (MISCELLANEOUS) ×10 IMPLANT
SEAL CANN UNIV 5-8 DVNC XI (MISCELLANEOUS) ×12 IMPLANT
SEAL XI 5MM-8MM UNIVERSAL (MISCELLANEOUS) ×20
SET TRI-LUMEN FLTR TB AIRSEAL (TUBING) ×5 IMPLANT
SET TUBE SMOKE EVAC HIGH FLOW (TUBING) ×5 IMPLANT
SOL ANTI FOG 6CC (MISCELLANEOUS) ×3 IMPLANT
SOLUTION ANTI FOG 6CC (MISCELLANEOUS) ×2
SUT GORETEX NAB #0 THX26 36IN (SUTURE) ×20 IMPLANT
SUT VIC AB 4-0 PS2 27 (SUTURE) ×10 IMPLANT
SUT VICRYL 0 UR6 27IN ABS (SUTURE) ×10 IMPLANT
SUT VLOC 180 2-0 9IN GS21 (SUTURE) ×5 IMPLANT
SYS BAG RETRIEVAL 10MM (BASKET) ×3
SYSTEM BAG RETRIEVAL 10MM (BASKET) ×3 IMPLANT
TIP UTERINE 5.1X6CM LAV DISP (MISCELLANEOUS) ×10 IMPLANT
TIP UTERINE 6.7X8CM BLUE DISP (MISCELLANEOUS) ×5 IMPLANT
TOWEL OR 17X26 10 PK STRL BLUE (TOWEL DISPOSABLE) ×10 IMPLANT
TRENDGUARD 450 HYBRID PRO PACK (MISCELLANEOUS) ×5
TROCAR PORT AIRSEAL 5X120 (TROCAR) ×5 IMPLANT
TROCAR XCEL NON BLADE 8MM B8LT (ENDOMECHANICALS) ×5 IMPLANT
WATER STERILE IRR 500ML POUR (IV SOLUTION) ×5 IMPLANT

## 2019-12-30 NOTE — Anesthesia Procedure Notes (Signed)
Performed by: Salbador Fiveash T, CRNA       

## 2019-12-30 NOTE — Anesthesia Postprocedure Evaluation (Signed)
Anesthesia Post Note  Patient: Tamara Mccoy  Procedure(s) Performed: XI ROBOTIC ASSISTED SUPRACERVICAL HYSTERECTOMY WITH BILATERAL SALPINGECTOMY (Bilateral Abdomen) XI ROBOTIC ASSISTED LAPAROSCOPIC SACROCOLPOPEXY (N/A Abdomen)     Patient location during evaluation: PACU Anesthesia Type: General Level of consciousness: awake and alert and oriented Pain management: pain level controlled Vital Signs Assessment: post-procedure vital signs reviewed and stable Respiratory status: spontaneous breathing, nonlabored ventilation and respiratory function stable Cardiovascular status: blood pressure returned to baseline Postop Assessment: no apparent nausea or vomiting Anesthetic complications: no   No complications documented.  Last Vitals:  Vitals:   12/30/19 1345 12/30/19 1357  BP: 108/73 113/78  Pulse: 80 85  Resp: 13 17  Temp:  (!) 36.3 C  SpO2: 96% 100%    Last Pain:  Vitals:   12/30/19 1357  TempSrc:   PainSc: [place]

## 2019-12-30 NOTE — Op Note (Signed)
Operative Note  12/30/2019  1:09 PM  PATIENT:  Tamara Mccoy  54 y.o. female  PRE-OPERATIVE DIAGNOSIS:  Complete uterine prolapse wtih prolapse of anterior vaginal wall  POST-OPERATIVE DIAGNOSIS:  Complete uterine prolapse wtih prolapse of anterior vaginal wall  PROCEDURE:  Procedure(s): XI ROBOTIC ASSISTED SUPRACERVICAL HYSTERECTOMY WITH BILATERAL SALPINGECTOMY, LYSIS OF ADHESIONS XI ROBOTIC ASSISTED LAPAROSCOPIC SACROCOLPOPEXY  SURGEON:  Surgeon(s): [name], MD Joseph Pierini, MD  A first assist was needed for this case given the complexity of the case.  The first assistant was needed for the entire duration of the case.  The assistant performed tissue exposure with retraction, irrigation and suction, passage of the mesh, passage of needles.  ANESTHESIA:   general  FINDINGS: Uterus, tubes and ovaries normal.  Appendix normal.  Adhesions between the colon and the right lower abdominal wall.  DESCRIPTION OF OPERATION: Under general anesthesia with endotracheal intubation the patient is in lithotomy position.  She is prepped with DuraPrep on the abdomen and with Betadine on the suprapubic, vulvar and vaginal areas.  The patient is draped as usual.  Timeout is done.  The Foley is inserted in the bladder.  A #8 Rumi with the small Koh ring are put in place at the level of the uterus and cervix.  The other instruments are removed.  We go to the abdomen.  We infiltrated the supraumbilical area with Marcaine one quarter plain.  A 1.5 cm incision is made at that level.  The aponeurosis is grasped with Coker's and opened with Mayo scissors under direct vision.  The parietal peritoneum is opened bluntly with the finger.  We make a pursestring stitch of Vicryl 0 in the aponeurosis.  The Hossein is inserted at that level and the pneumoperitoneum is created with CO2.  The camera is inserted.  The abdominal wall is clear for port insertion.  The uterus is small and normal in appearance.   Both tubes are normal.  Both ovaries are normal.  Adhesions between the colon and the right lower abdominal wall are present.  The camera was removed.  The skin is infiltrated with Marcaine one quarter plain at each incision site.  Small incisions are made with the scalpel L in line with the umbilicus with 2 on the right side and 2 on the left side.  The robotic ports are entered under direct vision with 2 on the right and one on the distal left.  The assistant ports a 5 mm port is inserted at the left medial aspect.  The patient is positioned in 30 degree Trendelenburg.  The robot is docked.  Targeting is done.  Robotic instruments are inserted under direct vision with the fenestrated clamp in the fourth arm, the scissors in the third arm, the camera in the second arm and the long bipolar in the first arm.  We go to the console.    Both ureters are seen with excellent peristalsis.  Pictures are taken of the pelvic structures as well as the appendix which is normal.  Lysis of adhesions at the right lower abdominal wall.  We then proceeded with the supracervical hysterectomy with bilateral salpingectomy.  We cauterized and section the right mesosalpinx.  We cauterized and section the right utero-ovarian ligament.  We cauterized and sectioned right round ligament.  We start descending and the right broad ligament and opened the visceral peritoneum anteriorly.  We proceeded exactly the same way on the left side.  We descend the bladder well past the [facility] ring  to prepare for the attachment of the mesh at that level.  We then open the visceral peritoneum posteriorly to prepare for mesh attachment at the back.  We then cauterized and section the right and left uterine arteries higher up at the junction of the uterus and cervix.  We section the uterus at the junction between the cervix and the uterus starting anteriorly then posteriorly and we finished on either side to completely detach the uterus with all the cervix.   The uterus with both tubes is placed in the posterior cul-de-sac until we remove it from the abdomen and pelvis at the end of the surgery.  We now open the visceral peritoneum at the level of the sacrum and continue to open the visceral peritoneum all the way to the cervix.  We create a tunnel in which the mesh will lay behind the peritoneum.  We further dissect the sacral and promontory area to see the anterior ligament of the spine.      We measured the mesh at 5 cm anteriorly and 3 cm posteriorly for the vaginal and cervical attachments.  The mesh is inserted in the pelvis.  We use Gore-Tex 0 to make separate stitches to fix the mesh on the vagina anteriorly and posteriorly as well as the cervix.  The instruments were changed to the cutting needle driver in the thyroid arm and the regular needle driver in the first arm.  The long bipolar was switched to the fourth arm.  6 separate stitches with Gore-Tex are applied anteriorly and 4 are applied posteriorly to fix the mesh.  We then pulled the tail towards the sacrum and promontory and hold it there as we go vaginally to verify that the support is just right without excessive tension.  We then come back to the console and trim the tail of the Y mesh at the right length.  We fix the mesh to the anterior ligament of the spine at the level of the promontory with 2 separate stitches of Gore-Tex 0.      We then used V-Loc 2-0 at 9 inches to close the peritoneum in a running suture above the mesh.  The mesh is completely covered retroperitoneally.  Hemostasis was adequate at all levels all along.  Pictures are taken.  Both ureters show excellent peristalsis.  Urine is clear.  The robotic instruments are removed.  All needles were already removed.  We go to laparoscopy time.  We used an Endobag at the supraumbilical port to remove the uterus with both tubes.  The specimen is easily brought out using the scalpel technique to make it into a cylinder.  The specimen is sent  to pathology.  The CO2 is evacuated.  The supraumbilical incision is closed by attaching the pursestring.  All incisions are closed with a subcuticular suture of Vicryl 4-0.  A separate stitch of Vicryl 4-0 was done at the adipose tissue if required for hemostasis.  Dermabond is added on all incisions.  A vaginal exam is then done to make sure that nothing is left in the vagina.  The suspension is very good.  The cystocele was corrected with the sacrocolpopexy and no additional intervention was needed.  The patient was brought to the recovery room in good and stable status.  ESTIMATED BLOOD LOSS: 25 mL   Intake/Output Summary (Last 24 hours) at 12/30/2019 1309 Last data filed at 12/30/2019 1257 Gross per 24 hour  Intake 1157.43 ml  Output 125 ml  Net 1032.43  ml     BLOOD ADMINISTERED:none   LOCAL MEDICATIONS USED:  MARCAINE     SPECIMEN:  Source of Specimen:  Uterus (without cervix) and bilateral tubes.  DISPOSITION OF SPECIMEN:  PATHOLOGY  COUNTS:  YES  PLAN OF CARE: Transfer to PACU  Marie-Lyne LavoieMD1:09 PM

## 2019-12-30 NOTE — Transfer of Care (Signed)
Immediate Anesthesia Transfer of Care Note  Patient: KARRAH MANGINI  Procedure(s) Performed: XI ROBOTIC ASSISTED SUPRACERVICAL HYSTERECTOMY WITH BILATERAL SALPINGECTOMY (Bilateral Abdomen) XI ROBOTIC ASSISTED LAPAROSCOPIC SACROCOLPOPEXY (N/A Abdomen)  Patient Location: PACU  Anesthesia Type:General  Level of Consciousness: sedated  Airway & Oxygen Therapy: Patient Spontanous Breathing and Patient connected to nasal cannula oxygen  Post-op Assessment: Report given to RN  Post vital signs: Reviewed and stable  Last Vitals:  Vitals Value Taken Time  BP 114/73 12/30/19 1311  Temp 36.3 C 12/30/19 1308  Pulse 90 12/30/19 1312  Resp 16 12/30/19 1312  SpO2 98 % 12/30/19 1312  Vitals shown include unvalidated device data.  Last Pain:  Vitals:   12/30/19 0800  TempSrc: Oral      Patients Stated Pain Goal: 5 (56/43/32 9518)  Complications: No complications documented.

## 2019-12-30 NOTE — Anesthesia Procedure Notes (Signed)
Procedure Name: Intubation Date/Time: 12/30/2019 8:41 AM Performed by: Bonney Aid, CRNA Pre-anesthesia Checklist: Patient identified, Emergency Drugs available, Suction available and Patient being monitored Patient Re-evaluated:Patient Re-evaluated prior to induction Oxygen Delivery Method: Circle system utilized Preoxygenation: Pre-oxygenation with 100% oxygen Induction Type: IV induction Ventilation: Mask ventilation without difficulty Laryngoscope Size: Mac Grade View: Grade II Tube type: Oral Tube size: 7.5 mm Number of attempts: 1 Airway Equipment and Method: Stylet Placement Confirmation: ETT inserted through vocal cords under direct vision,  positive ETCO2 and breath sounds checked- equal and bilateral Secured at: 21 cm Tube secured with: Tape Dental Injury: Teeth and Oropharynx as per pre-operative assessment

## 2019-12-30 NOTE — Progress Notes (Signed)
Attempted to call Dr. Dellis Filbert x 3 without success, line states voicemail not set up. Paged MD on call, Dr. Quincy Simmonds for orders for patient's home medications. Orders received for metoprolol and wellbutrin XL. VSS.

## 2019-12-30 NOTE — H&P (Signed)
Tamara Mccoy is an 54 y.o. female.G3P3L3  RP: Complete uterine prolapse with Cystocele grade 4/4 for XI Robotic Supracervical Hysterectomy with Bilateral Salpingectomy, Sacrocolpopexy. Possible Cystocele repair  HPI: No change x last visit 12/09/2019. Very symptomatic complete uterine prolapse with cystocele.  Patient has decreased her physical activity because of discomfort and pain when walking due to the uterine prolapse and cystocele.  Experiences discomfort and pain from the rubbing of the protrusion at the vulva.  Has to push the bladder back in the vagina to pass urine.  Occasional leakage of urine and urgency.  Bowel movements normal.  Postmenopausal x1 year.  No hormone replacement therapy.  No postmenopausal bleeding.   Menstrual History: Patient's last menstrual period was 01/06/2016.    Past Medical History:  Diagnosis Date  . Allergy   . Anxiety   . Bilateral carpal tunnel syndrome    Spasms and saw PT  . Cancer [facility])    Skin cancer lower abdomen  . Depression   . Female bladder prolapse   . GERD (gastroesophageal reflux disease)   . History of chicken pox   . History of gestational diabetes   . Hypertension   . Hypothyroidism   . IBS (irritable bowel syndrome)   . Migraine     Past Surgical History:  Procedure Laterality Date  . COLONOSCOPY  09/25/2007  . COLONOSCOPY  04/2019  . WISDOM TOOTH EXTRACTION      Family History  Problem Relation Age of Onset  . COPD Mother   . Kidney failure Mother        only one working   . Heart Problems Mother        stent   . Uterine cancer Mother   . Heart block Father   . Prostate cancer Father   . Leukemia Maternal Grandmother   . Leukemia Maternal Uncle   . Heart attack Maternal Grandfather   . Heart Problems Paternal Grandmother        bypass  . Prostate cancer Paternal Grandfather   . Colon cancer Neg Hx   . Colon polyps Neg Hx   . Esophageal cancer Neg Hx   . Stomach cancer Neg Hx   . Rectal cancer  Neg Hx     Social History:  reports that she has never smoked. She has never used smokeless tobacco. She reports current alcohol use. She reports that she does not use drugs.  Allergies:  Allergies  Allergen Reactions  . Contrast Media [Iodinated Diagnostic Agents] Hives  . Erythromycin Other (See Comments)    unknown  . Iohexol      Code: HIVES, Desc: Hives on face post injection of 100cc's Omni., Onset Date: [id number]   . Zithromax [Azithromycin] Other (See Comments)    Heart race  . Penicillins Rash    Medications Prior to Admission  Medication Sig Dispense Refill Last Dose  . buPROPion (WELLBUTRIN XL) 150 MG 24 hr tablet TAKE 1 TABLET(150 MG TOTAL) BY MOUTH DAILY. SCHEDULE PHYSICAL EXAM (Patient taking differently: Take 150 mg by mouth daily. ) 30 tablet 0   . levonorgestrel (MIRENA) 20 MCG/24HR IUD 1 each by Intrauterine route once. Inserted 02/2016     . levothyroxine (SYNTHROID) 100 MCG tablet Take 100 mcg by mouth daily.     . metoprolol tartrate (LOPRESSOR) 25 MG tablet Take 12.5 mg by mouth 2 (two) times daily.     . Multiple Vitamin (MULTIVITAMIN) tablet Take 1 tablet by mouth daily.     . vitamin  B-12 (CYANOCOBALAMIN) 1000 MCG tablet Take 1,000 mcg by mouth daily.     Marland Kitchen zinc gluconate 50 MG tablet Take 50 mg by mouth daily.     Marland Kitchen omeprazole (PRILOSEC) 20 MG capsule TAKE 1 CAPSULE(20 MG) BY MOUTH DAILY (Patient not taking: Reported on 12/16/2019) 30 capsule 1 Not Taking at Unknown time  . SELENIUM PO Take by mouth. (Patient not taking: Reported on 12/16/2019)   Not Taking at Unknown time  . simethicone (GAS-X) 80 MG chewable tablet Chew 1 tablet (80 mg total) by mouth every 6 (six) hours as needed for flatulence. (Patient not taking: Reported on 12/16/2019) 15 tablet 0 Not Taking at Unknown time  . tinidazole (TINDAMAX) 500 MG tablet Take by mouth daily with breakfast.       REVIEW OF SYSTEMS: A ROS was performed and pertinent positives and negatives are included in the  history.  GENERAL: No fevers or chills. HEENT: No change in vision, no earache, sore throat or sinus congestion. NECK: No pain or stiffness. CARDIOVASCULAR: No chest pain or pressure. No palpitations. PULMONARY: No shortness of breath, cough or wheeze. GASTROINTESTINAL: No abdominal pain, nausea, vomiting or diarrhea, melena or bright red blood per rectum. GENITOURINARY: No urinary frequency, urgency, hesitancy or dysuria. MUSCULOSKELETAL: No joint or muscle pain, no back pain, no recent trauma. DERMATOLOGIC: No rash, no itching, no lesions. ENDOCRINE: No polyuria, polydipsia, no heat or cold intolerance. No recent change in weight. HEMATOLOGICAL: No anemia or easy bruising or bleeding. NEUROLOGIC: No headache, seizures, numbness, tingling or weakness. PSYCHIATRIC: No depression, no loss of interest in normal activity or change in sleep pattern.     Last menstrual period 01/06/2016.  Physical Exam:   Results for orders placed or performed during the hospital encounter of 12/30/19 (from the past 24 hour(s))  Pregnancy, urine POC     Status: None   Collection Time: 12/30/19  6:46 AM  Result Value Ref Range   Preg Test, Ur NEGATIVE NEGATIVE   Abdomen: Normal  Gynecologic exam: Vulva normal.  Bimanual exam in laying down position: Uterus AV, normal volume, mobile, NT.  No adnexal mass.  In standing position with valsalva: Complete uterine prolapse and Cystocele grade 4/4.  No Rectocele.    Assessment/Plan:  54 y.o. G3P3   1. Complete uterine prolapse with prolapse of anterior vaginal wall Very symptomatic complete uterine prolapse and cystocele grade 4/4.  Attempted pessary without success in the past.  Desires surgical correction.  We will proceed with XI robotic supracervical hysterectomy with bilateral salpingectomy, sacrocolpopexy with Y mesh.  Possible anterior repair.  The decision to preserve or remove the ovaries thoroughly reviewed with patient.  Decision to preserve her ovaries at  patient's preference and in concordance with recommendations.  Preop preparation, surgical procedure with risks and benefits as well as postop expectations and precautions thoroughly reviewed.  Patient voiced understanding and agreement with plan.  Surgery scheduled for December 30, 2019.  Pamphlet already received and read.  Patient's kidney function was borderline recently, will be reassessed with labs on December 25, 2019.                         Patient was counseled as to the risk of surgery to include the following:  1. Infection (prohylactic antibiotics will be administered)  2. DVT/Pulmonary Embolism (prophylactic pneumo compression stockings will be used)  3.Trauma to internal organs requiring additional surgical procedure to repair any injury to internal organs requiring perhaps additional  hospitalization days.  4.Hemmorhage requiring transfusion and blood products which carry risks such as anaphylactic reaction, hepatitis and AIDS  Patient had received literature information on the procedure scheduled and all her questions were answered and fully accepts all risk.  Marie-Lyne Barbie Croston 12/30/2019, 7:54 AM

## 2019-12-31 ENCOUNTER — Encounter (HOSPITAL_BASED_OUTPATIENT_CLINIC_OR_DEPARTMENT_OTHER): Payer: Self-pay | Admitting: Obstetrics & Gynecology

## 2019-12-31 DIAGNOSIS — N814 Uterovaginal prolapse, unspecified: Secondary | ICD-10-CM | POA: Diagnosis not present

## 2019-12-31 LAB — SURGICAL PATHOLOGY

## 2019-12-31 MED ORDER — OXYCODONE-ACETAMINOPHEN 5-325 MG PO TABS
ORAL_TABLET | ORAL | Status: AC
  Filled 2019-12-31: qty 2

## 2019-12-31 MED ORDER — HYDROCODONE-ACETAMINOPHEN 5-325 MG PO TABS: 1.0000 | ORAL_TABLET | Freq: Four times a day (QID) | ORAL | 0 refills | Status: DC | PRN

## 2019-12-31 NOTE — Progress Notes (Signed)
POD#1 XI Robotic Supracervical Hysterectomy with Bilateral Salpingectomy, Lysis of Adhesions, Sacrocolpopexy.  Subjective: Patient reports tolerating PO, + flatus and no problems voiding.    Objective: I have reviewed patient's vital signs.  vital signs, intake and output, medications and labs.  Vitals:   12/31/19 0130 12/31/19 0445  BP: 106/62 (!) 98/58  Pulse: 84 87  Resp: 18 18  Temp: 98.7 F (37.1 C) 97.8 F (36.6 C)  SpO2: 97% 99%   I/O last 3 completed shifts: In: 3928.7 [P.O.:1340; I.V.:2431.3; IV Piggyback:157.4] Out: 1575 [Urine:1550; Blood:25] No intake/output data recorded.  Results for orders placed or performed during the hospital encounter of 12/30/19 (from the past 24 hour(s))  CBC     Status: Abnormal   Collection Time: 12/30/19  3:59 PM  Result Value Ref Range   WBC 10.8 (H) 4.0 - 10.5 K/uL   RBC 4.22 3.87 - 5.11 MIL/uL   Hemoglobin 13.4 12.0 - 15.0 g/dL   HCT 41.3 36 - 46 %   MCV 97.9 80.0 - 100.0 fL   MCH 31.8 26.0 - 34.0 pg   MCHC 32.4 30.0 - 36.0 g/dL   RDW 12.7 11.5 - 15.5 %   Platelets 206 150 - 400 K/uL   nRBC 0.0 0.0 - 0.2 %    EXAM General: alert, cooperative and appears stated age Resp: clear to auscultation bilaterally Cardio: regular rate and rhythm GI: normal findings: bowel sounds normal and soft, non-tender and incision: clean, dry and intact Extremities: no edema, redness or tenderness in the calves or thighs Vaginal Bleeding: none  Assessment: s/p Procedure(s): XI ROBOTIC ASSISTED SUPRACERVICAL HYSTERECTOMY WITH BILATERAL SALPINGECTOMY XI ROBOTIC ASSISTED LAPAROSCOPIC SACROCOLPOPEXY: stable, progressing well and tolerating diet  Plan: Discharge home  LOS: 0 days    [name], MD 12/31/2019 8:22 AM

## 2019-12-31 NOTE — Discharge Instructions (Signed)
Sacrocolpopexy, Care After This sheet gives you information about how to care for yourself after your procedure. Your health care provider may also give you more specific instructions. If you have problems or questions, contact your health care provider. What can I expect after the procedure? After the procedure, it is common to have:  Pain.  Some vaginal bleeding.  Tiredness (fatigue). Follow these instructions at home: Medicines  Take over-the-counter and prescription medicines only as told by your health care provider.  If you were prescribed an antibiotic medicine, take it as told by your health care provider. Do not stop taking the antibiotic even if you start to feel better.  Do not drive or use heavy machinery while taking prescription pain medicine. Incision care   Follow instructions from your health care provider about how to take care of your incisions. Make sure you: ? Wash your hands with soap and water before you change your bandage (dressing). If soap and water are not available, use hand sanitizer. ? Change your dressing as told by your health care provider. ? Leave stitches (sutures), skin glue, or adhesive strips in place. These skin closures may need to stay in place for 2 weeks or longer. If adhesive strip edges start to loosen and curl up, you may trim the loose edges. Do not remove adhesive strips completely unless your health care provider tells you to do that.  Check your incision areas every day for signs of infection. Check for: ? Redness, swelling, or pain. ? Fluid or blood. ? Warmth. ? Pus or a bad smell.  Do not take baths, swim, or use a hot tub until your health care provider says it is okay to do so. Do not shower for 24 hours or until after your bandages have been removed. Activity  Take frequent, short walks throughout the day. Rest when you get tired.  Limit your activities as told by your health care provider. For at least 6 weeks: ? Do not  lift anything that is heavier than 10 lb (4.5 kg), or the limit that your health care provider tells you, until he or she says that it is safe. ? Do not sit on a bike seat. ? Do not use a tampon or put anything inside your vagina. ? Do not have sexual intercourse. ? Do not participate in activities that take a lot of effort (strenuous), such as running or aerobics.  Ask your health care provider when you can return to work and do all your usual activities. It may take 3 months to recover completely. General instructions  To prevent or treat constipation while you are taking prescription pain medicine, your health care provider may recommend that you: ? Drink enough fluid to keep your urine pale yellow. ? Take over-the-counter or prescription medicines. ? Eat foods that are high in fiber, such as fresh fruits and vegetables, whole grains, and beans. ? Limit foods that are high in fat and processed sugars, such as fried and sweet foods.  Keep all follow-up visits as told by your health care provider. This is important. Contact a health care provider if:  You have chills or a fever.  Your pain medicine is not helping.  You have vaginal bleeding or discharge that will not go away.  You have any signs of infection, such as: ? Redness, swelling, or pain around your incision. ? Fluid or blood coming from your incision. ? Your incision feeling warm to the touch. ? Pus or a bad smell  coming from your incision. Get help right away if:  You have a fever for more than 2-3 days.  You have very bad pain.  You have heavy vaginal bleeding or discharge.  You have a foul smell coming from your vagina area.  You develop a warm, tender area in your leg.  You have chest pain or trouble breathing. Summary  After the procedure, it is common to have some vaginal bleeding.  Limit your activities as told by your health care provider. For at least 6 weeks, do not lift anything heavy, have sexual  intercourse, use tampons, sit on a bike seat, or participate in strenuous activities.  Follow instructions from your health care provider about how to take care of your incisions. Check your incision areas every day for signs of infection.  To prevent blood clots, take frequent, short walks throughout the day. Rest when you get tired. This information is not intended to replace advice given to you by your health care provider. Make sure you discuss any questions you have with your health care provider. Document Revised: 04/20/2017 Document Reviewed: 08/03/2016 Elsevier Patient Education  [place].

## 2019-12-31 NOTE — Discharge Summary (Signed)
Physician Discharge Summary  Patient ID: Tamara Mccoy MRN: [id number] DOB/AGE: 56-Apr-1967 56 y.o.  Admit date: 12/30/2019 Discharge date: 12/31/2019  Admission Diagnoses: Postoperative state [Z98.890] Post-operative state [Z98.890]   Discharge Diagnoses:  Active Problems:   Postoperative state   Post-operative state   Discharged Condition: good  Consults:None  Significant Diagnostic Studies: labs: Postop Hb 13.4  Treatments:surgery: XI Robotic Supracervical Hysterectomy/Bilateral Salpingectomy/Lysis of Adhesions/Sacrocolpopexy  Vitals:   12/31/19 0130 12/31/19 0445  BP: 106/62 (!) 98/58  Pulse: 84 87  Resp: 18 18  Temp: 98.7 F (37.1 C) 97.8 F (36.6 C)  SpO2: 97% 99%     Hospital Course: Good  Discharge Exam: Normal postop  Disposition: D/C home    Allergies as of 12/31/2019      Reactions   Contrast Media [iodinated Diagnostic Agents] Hives   Erythromycin Other (See Comments)   unknown   Iohexol     Code: HIVES, Desc: Hives on face post injection of 100cc's Omni., Onset Date: [id number]   Zithromax [azithromycin] Other (See Comments)   Heart race   Penicillins Rash      Medication List    STOP taking these medications   levonorgestrel 20 MCG/24HR IUD Commonly known as: MIRENA   tinidazole 500 MG tablet Commonly known as: TINDAMAX     TAKE these medications   buPROPion 150 MG 24 hr tablet Commonly known as: WELLBUTRIN XL TAKE 1 TABLET(150 MG TOTAL) BY MOUTH DAILY. SCHEDULE PHYSICAL EXAM What changed: See the new instructions.   HYDROcodone-acetaminophen 5-325 MG tablet Commonly known as: Norco Take 1 tablet by mouth every 6 (six) hours as needed for moderate pain.   levothyroxine 100 MCG tablet Commonly known as: SYNTHROID Take 100 mcg by mouth daily.   metoprolol tartrate 25 MG tablet Commonly known as: LOPRESSOR Take 12.5 mg by mouth 2 (two) times daily.   multivitamin tablet Take 1 tablet by mouth daily.   omeprazole 20 MG  capsule Commonly known as: PRILOSEC TAKE 1 CAPSULE(20 MG) BY MOUTH DAILY   SELENIUM PO Take by mouth.   simethicone 80 MG chewable tablet Commonly known as: Gas-X Chew 1 tablet (80 mg total) by mouth every 6 (six) hours as needed for flatulence.   vitamin B-12 1000 MCG tablet Commonly known as: CYANOCOBALAMIN Take 1,000 mcg by mouth daily.   zinc gluconate 50 MG tablet Take 50 mg by mouth daily.         Follow-up Information    [name], MD Follow up in 3 week(s).   Specialty: Obstetrics and Gynecology Contact information: [address] [place] [address] [phone]               Signed: [name] 12/31/2019, 8:25 AM

## 2020-01-02 ENCOUNTER — Other Ambulatory Visit: Payer: Self-pay | Admitting: Obstetrics & Gynecology

## 2020-01-02 ENCOUNTER — Telehealth: Payer: Self-pay

## 2020-01-02 MED ORDER — HYDROCODONE-ACETAMINOPHEN 5-325 MG PO TABS: 1.0000 | ORAL_TABLET | Freq: Four times a day (QID) | ORAL | 0 refills | Status: DC | PRN

## 2020-01-02 NOTE — Telephone Encounter (Signed)
I spoke with Dr. Marguerita Merles about this. She sent Rx for patient. I called patient and informed her.

## 2020-01-02 NOTE — Telephone Encounter (Signed)
Patient said she takes her last pain pill at 1:00pm today. She questioned if you will prescribe her a few more to take her through the weekend.

## 2020-01-06 ENCOUNTER — Encounter: Payer: Self-pay | Admitting: Anesthesiology

## 2020-01-14 ENCOUNTER — Telehealth: Payer: Self-pay

## 2020-01-14 NOTE — Telephone Encounter (Signed)
Had Robotic Assisted Supracervical Hysterectomy, BSE on 12/30/19 with ML.  Has p.op visit scheduled for 01/19/20 with ML.  Wanted to know if she can: 1. Go for a car ride? 2. Drive? 3. "move around a little more"?

## 2020-01-14 NOTE — Telephone Encounter (Signed)
Called patient back and per DPR access note on file I left detailed message in her voice mail reading her Dr. Marthann Schiller reply.

## 2020-01-14 NOTE — Telephone Encounter (Signed)
I would say if she has not had any complications so far, it would be okay to go for car ride, I tell patients they can drive 1 week after the surgery if not taking any narcotic pain relievers and they can make all the motions they need to do to drive without any pain. It is fine to walk a bit more but I would avoid any contact sports, bending over at the waist, twisting at the waist, stooping, and no lifting greater than 20 pounds

## 2020-01-19 ENCOUNTER — Other Ambulatory Visit: Payer: Self-pay

## 2020-01-19 ENCOUNTER — Encounter: Payer: Self-pay | Admitting: Obstetrics & Gynecology

## 2020-01-19 ENCOUNTER — Ambulatory Visit (INDEPENDENT_AMBULATORY_CARE_PROVIDER_SITE_OTHER): Payer: 59 | Admitting: Obstetrics & Gynecology

## 2020-01-19 VITALS — BP 120/80

## 2020-01-19 DIAGNOSIS — Z09 Encounter for follow-up examination after completed treatment for conditions other than malignant neoplasm: Secondary | ICD-10-CM

## 2020-01-19 NOTE — Progress Notes (Signed)
° ° °  Tamara Mccoy June 04, 1965 [id number]        54 y.o.  G3P3L3  RP: Postop XI robotic supracervical hysterectomy with bilateral salpingectomy and sacrocolpopexy on December 30, 2019  HPI: Good postop evolution, mild abdominal pelvic discomfort improving since surgery.  No vaginal bleeding or abnormal vaginal discharge.  Urine and bowel movements normal.  No fever.  Had an episode of dizziness after getting up from her church service.  No fainting.  No diaphoresis, palpitations, chest pain or pain to the jaw or left arm.  Medical evaluation was negative including an EKG.  Probably an episode of relative dehydration.   OB History  Gravida Para Term Preterm AB Living  3 3       3   SAB TAB Ectopic Multiple Live Births               # Outcome Date GA Lbr Len/2nd Weight Sex Delivery Anes PTL Lv  3 Para           2 Para           1 Para             Past medical history,surgical history, problem list, medications, allergies, family history and social history were all reviewed and documented in the EPIC chart.   Directed ROS with pertinent positives and negatives documented in the history of present illness/assessment and plan.  Exam:  Vitals:   01/19/20 1617  BP: 120/80   General appearance:  Normal  Abdomen: Incisions well healed.  Trimmed the sutures at the skin.  Gynecologic exam:  Vulva normal.  Bimanual exam normal/Cervix normal/no pelvic mass/no tenderness.  Good support of cervix and vaginal walls.   Assessment/Plan:  54 y.o. G3P3   1. Status post gynecological surgery, follow-up exam Good postop evolution with no complication.  Good support of the cervix and vaginal walls.  Patient will follow-up in 4 weeks.  [place] of [place].  [name] MD, 4:31 PM 01/19/2020

## 2020-01-20 ENCOUNTER — Telehealth: Payer: Self-pay

## 2020-01-20 ENCOUNTER — Encounter: Payer: Self-pay | Admitting: Obstetrics & Gynecology

## 2020-01-20 NOTE — Telephone Encounter (Signed)
C/o extreme hot flashes since surgery. Asked if something you can prescribe or recommend.

## 2020-01-21 ENCOUNTER — Encounter: Payer: Self-pay | Admitting: Anesthesiology

## 2020-01-21 NOTE — Telephone Encounter (Signed)
Bring in to discuss HRT.  Will prescribe after counseling done.

## 2020-01-21 NOTE — Telephone Encounter (Addendum)
Patient informed to sched visit to discuss HRT.  Informed that her FMLA forms are ready for pick up.  Patient asked about having her annual mammogram and exam/Pap with another MD before she sees ML 02/17/20. I advised her fine to have her Pap smear but she should hold off on the pelvic/Pap until after her final post op visit with Dr. Marguerita Merles.

## 2020-01-30 ENCOUNTER — Other Ambulatory Visit: Payer: Self-pay | Admitting: Internal Medicine

## 2020-02-04 ENCOUNTER — Encounter: Payer: Self-pay | Admitting: Anesthesiology

## 2020-02-17 ENCOUNTER — Ambulatory Visit (INDEPENDENT_AMBULATORY_CARE_PROVIDER_SITE_OTHER): Payer: 59 | Admitting: Obstetrics & Gynecology

## 2020-02-17 ENCOUNTER — Encounter: Payer: Self-pay | Admitting: Obstetrics & Gynecology

## 2020-02-17 ENCOUNTER — Other Ambulatory Visit: Payer: Self-pay

## 2020-02-17 VITALS — BP 122/80

## 2020-02-17 DIAGNOSIS — Z09 Encounter for follow-up examination after completed treatment for conditions other than malignant neoplasm: Secondary | ICD-10-CM

## 2020-02-17 NOTE — Progress Notes (Signed)
    Tamara Mccoy [birth year] [id number]        54 y.o.  G3P3L3  Stable boyfriend.  Just moved to [place].  RP: Postop XI robotic supracervical hysterectomy with bilateral salpingectomy and sacrocolpopexy on December 30, 2019  HPI: Good postop evolution. No pelvic pain.  No vaginal bleeding or discharge.  Good support.  No SUI.  Urine/BMs normal.  No fever.     OB History  Gravida Para Term Preterm AB Living  3 3       3   SAB TAB Ectopic Multiple Live Births               # Outcome Date GA Lbr Len/2nd Weight Sex Delivery Anes PTL Lv  3 Para           2 Para           1 Para             Past medical history,surgical history, problem list, medications, allergies, family history and social history were all reviewed and documented in the EPIC chart.   Directed ROS with pertinent positives and negatives documented in the history of present illness/assessment and plan.  Exam:  Vitals:   02/17/20 1155  BP: 122/80   General appearance:  Normal  Abdomen: Incisions well healed.  Gynecologic exam: Vulva normal.  Bimanual exam:  Excellent support of the cervix and vaginal walls.  No pelvic mass, NT.   Assessment/Plan:  54 y.o. G3P3   1. Status post gynecological surgery, follow-up exam Good postop healing with no complication.  Excellent support of the cervix and vaginal walls from the sacrocolpopexy.  Patient very satisfied.  Can resume all physical activities and sexual intercourse.  Recommend avoiding excessive pressure on the pelvic floor.  Other orders - levothyroxine (SYNTHROID) 88 MCG tablet; Take 1 tablet by mouth daily.  [name] MD, 12:35 PM 02/17/2020

## 2020-02-19 ENCOUNTER — Encounter: Payer: Self-pay | Admitting: Obstetrics & Gynecology

## 2020-02-28 ENCOUNTER — Other Ambulatory Visit: Payer: Self-pay | Admitting: Internal Medicine

## 2020-04-28 ENCOUNTER — Other Ambulatory Visit: Payer: Self-pay | Admitting: Internal Medicine

## 2020-09-24 ENCOUNTER — Other Ambulatory Visit: Payer: Self-pay

## 2020-09-24 ENCOUNTER — Ambulatory Visit: Payer: Federal, State, Local not specified - PPO | Admitting: Obstetrics & Gynecology

## 2020-09-24 ENCOUNTER — Encounter: Payer: Self-pay | Admitting: Obstetrics & Gynecology

## 2020-09-24 VITALS — BP 122/76

## 2020-09-24 DIAGNOSIS — N941 Unspecified dyspareunia: Secondary | ICD-10-CM | POA: Diagnosis not present

## 2020-09-24 NOTE — Progress Notes (Signed)
    Tamara Mccoy April 30, 1966 [id number]        55 y.o.  G3P3   RP: Positional lower abdominal pain with IC  HPI: Postop XIrobotic supracervical hysterectomy with bilateral salpingectomyand sacrocolpopexy on December 30, 2019.  Doing very well since surgery with good suspension.  No urinary incontinence.  No vaginal bleeding.  Husband is overweight.  Feels discomfort on lower abdomen during IC when she is below.  No deep dyspareunia.  OB History  Gravida Para Term Preterm AB Living  3 3       3   SAB IAB Ectopic Multiple Live Births               # Outcome Date GA Lbr Len/2nd Weight Sex Delivery Anes PTL Lv  3 Para           2 Para           1 Para             Past medical history,surgical history, problem list, medications, allergies, family history and social history were all reviewed and documented in the EPIC chart.   Directed ROS with pertinent positives and negatives documented in the history of present illness/assessment and plan.  Exam:  Vitals:   09/24/20 1053  BP: 122/76   General appearance:  Normal  Abdomen: Soft, NT, not distended.  No mass felt.  Gynecologic exam: Vulva normal.  Bimanual exam:  Cervix/Vagina normal.  Good suspension.  NT.   Assessment/Plan:  55 y.o. G3P3   1. Dyspareunia in female Discomfort/pain with IC at the lower abdomen when under with the weight of her husband pressing that area.  Gynecologic/abdominal exam normal.  Good suspension post Sacrocolpopexy.  Will complete the investigation with a Pelvic US at f/u.                                                             - US Transvaginal Non-OB; Future  [name] MD, 11:08 AM 09/24/2020

## 2020-09-27 ENCOUNTER — Encounter: Payer: Self-pay | Admitting: Obstetrics & Gynecology
